# Patient Record
Sex: Female | Born: 1950
Health system: Southern US, Community
[De-identification: ages and names within clinical notes are randomized; demographics above are authoritative.]

## PROBLEM LIST (undated history)

## (undated) DIAGNOSIS — R202 Paresthesia of skin: Secondary | ICD-10-CM

## (undated) DIAGNOSIS — E119 Type 2 diabetes mellitus without complications: Secondary | ICD-10-CM

## (undated) DIAGNOSIS — Z8249 Family history of ischemic heart disease and other diseases of the circulatory system: Secondary | ICD-10-CM

## (undated) DIAGNOSIS — R002 Palpitations: Secondary | ICD-10-CM

## (undated) DIAGNOSIS — I351 Nonrheumatic aortic (valve) insufficiency: Secondary | ICD-10-CM

## (undated) DIAGNOSIS — K449 Diaphragmatic hernia without obstruction or gangrene: Secondary | ICD-10-CM

## (undated) DIAGNOSIS — E041 Nontoxic single thyroid nodule: Secondary | ICD-10-CM

## (undated) DIAGNOSIS — I10 Essential (primary) hypertension: Secondary | ICD-10-CM

## (undated) DIAGNOSIS — M1990 Unspecified osteoarthritis, unspecified site: Secondary | ICD-10-CM

## (undated) DIAGNOSIS — J45909 Unspecified asthma, uncomplicated: Secondary | ICD-10-CM

## (undated) DIAGNOSIS — K219 Gastro-esophageal reflux disease without esophagitis: Secondary | ICD-10-CM

## (undated) DIAGNOSIS — Z8719 Personal history of other diseases of the digestive system: Secondary | ICD-10-CM

## (undated) DIAGNOSIS — I8390 Asymptomatic varicose veins of unspecified lower extremity: Secondary | ICD-10-CM

## (undated) DIAGNOSIS — E785 Hyperlipidemia, unspecified: Secondary | ICD-10-CM

## (undated) HISTORY — DX: Paresthesia of skin: R20.2

## (undated) HISTORY — DX: Hyperlipidemia, unspecified: E78.5

## (undated) HISTORY — DX: Family history of ischemic heart disease and other diseases of the circulatory system: Z82.49

## (undated) HISTORY — DX: Palpitations: R00.2

## (undated) HISTORY — DX: Diaphragmatic hernia without obstruction or gangrene: K44.9

## (undated) HISTORY — DX: Asymptomatic varicose veins of unspecified lower extremity: I83.90

## (undated) HISTORY — DX: Unspecified osteoarthritis, unspecified site: M19.90

## (undated) HISTORY — DX: Nontoxic single thyroid nodule: E04.1

## (undated) HISTORY — DX: Type 2 diabetes mellitus without complications: E11.9

## (undated) HISTORY — DX: Personal history of other diseases of the digestive system: Z87.19

## (undated) HISTORY — DX: Nonrheumatic aortic (valve) insufficiency: I35.1

## (undated) HISTORY — DX: Essential (primary) hypertension: I10

## (undated) HISTORY — DX: Gastro-esophageal reflux disease without esophagitis: K21.9

---

## 1972-02-23 HISTORY — PX: GALLBLADDER SURGERY: SHX652

## 1988-02-23 HISTORY — PX: TUBAL LIGATION: SHX77

## 1995-02-23 HISTORY — PX: UMBILICAL HERNIA REPAIR: SHX196

## 2001-02-22 HISTORY — PX: HERNIA REPAIR: SHX51

## 2001-02-22 HISTORY — PX: ABDOMINAL HYSTERECTOMY: SHX81

## 2001-08-15 ENCOUNTER — Emergency Department (HOSPITAL_COMMUNITY): Admission: EM | Admit: 2001-08-15 | Discharge: 2001-08-15 | Payer: Self-pay | Admitting: Emergency Medicine

## 2002-06-18 ENCOUNTER — Encounter: Admission: RE | Admit: 2002-06-18 | Discharge: 2002-09-16 | Payer: Self-pay | Admitting: Internal Medicine

## 2002-07-16 ENCOUNTER — Other Ambulatory Visit: Admission: RE | Admit: 2002-07-16 | Discharge: 2002-07-16 | Payer: Self-pay | Admitting: Obstetrics and Gynecology

## 2002-07-18 ENCOUNTER — Ambulatory Visit: Admission: RE | Admit: 2002-07-18 | Discharge: 2002-07-18 | Payer: Self-pay | Admitting: Internal Medicine

## 2002-07-18 ENCOUNTER — Ambulatory Visit (HOSPITAL_COMMUNITY): Admission: RE | Admit: 2002-07-18 | Discharge: 2002-07-18 | Payer: Self-pay | Admitting: Internal Medicine

## 2002-07-18 ENCOUNTER — Encounter: Payer: Self-pay | Admitting: Internal Medicine

## 2002-08-20 ENCOUNTER — Ambulatory Visit (HOSPITAL_COMMUNITY): Admission: RE | Admit: 2002-08-20 | Discharge: 2002-08-20 | Payer: Self-pay | Admitting: *Deleted

## 2003-02-23 HISTORY — PX: COLON SURGERY: SHX602

## 2003-06-05 ENCOUNTER — Ambulatory Visit (HOSPITAL_COMMUNITY): Admission: RE | Admit: 2003-06-05 | Discharge: 2003-06-05 | Payer: Self-pay

## 2003-06-14 ENCOUNTER — Encounter: Admission: RE | Admit: 2003-06-14 | Discharge: 2003-06-14 | Payer: Self-pay | Admitting: *Deleted

## 2003-06-24 ENCOUNTER — Inpatient Hospital Stay (HOSPITAL_COMMUNITY): Admission: AD | Admit: 2003-06-24 | Discharge: 2003-06-29 | Payer: Self-pay

## 2003-12-17 ENCOUNTER — Other Ambulatory Visit: Admission: RE | Admit: 2003-12-17 | Discharge: 2003-12-17 | Payer: Self-pay | Admitting: Obstetrics and Gynecology

## 2005-04-27 ENCOUNTER — Other Ambulatory Visit: Admission: RE | Admit: 2005-04-27 | Discharge: 2005-04-27 | Payer: Self-pay | Admitting: Obstetrics and Gynecology

## 2008-12-26 ENCOUNTER — Inpatient Hospital Stay: Payer: Self-pay | Admitting: Internal Medicine

## 2010-03-26 ENCOUNTER — Other Ambulatory Visit: Payer: Self-pay | Admitting: Obstetrics and Gynecology

## 2010-03-26 DIAGNOSIS — N6320 Unspecified lump in the left breast, unspecified quadrant: Secondary | ICD-10-CM

## 2010-04-01 ENCOUNTER — Ambulatory Visit
Admission: RE | Admit: 2010-04-01 | Discharge: 2010-04-01 | Disposition: A | Discharge status: Home / Self Care | Payer: BC Managed Care – PPO | Source: Ambulatory Visit | Attending: Obstetrics and Gynecology | Admitting: Obstetrics and Gynecology

## 2010-04-01 DIAGNOSIS — N6320 Unspecified lump in the left breast, unspecified quadrant: Secondary | ICD-10-CM

## 2011-05-13 ENCOUNTER — Other Ambulatory Visit: Payer: Self-pay | Admitting: Obstetrics and Gynecology

## 2011-05-13 DIAGNOSIS — Z1231 Encounter for screening mammogram for malignant neoplasm of breast: Secondary | ICD-10-CM

## 2011-06-02 ENCOUNTER — Ambulatory Visit: Payer: BC Managed Care – PPO

## 2011-06-23 ENCOUNTER — Ambulatory Visit
Admission: RE | Admit: 2011-06-23 | Discharge: 2011-06-23 | Disposition: A | Discharge status: Home / Self Care | Payer: BC Managed Care – PPO | Source: Ambulatory Visit | Attending: Obstetrics and Gynecology | Admitting: Obstetrics and Gynecology

## 2011-06-23 DIAGNOSIS — Z1231 Encounter for screening mammogram for malignant neoplasm of breast: Secondary | ICD-10-CM

## 2012-05-30 ENCOUNTER — Other Ambulatory Visit: Payer: Self-pay | Admitting: Gastroenterology

## 2012-07-18 ENCOUNTER — Other Ambulatory Visit: Payer: Self-pay

## 2012-07-18 DIAGNOSIS — Z1231 Encounter for screening mammogram for malignant neoplasm of breast: Secondary | ICD-10-CM

## 2012-08-16 ENCOUNTER — Ambulatory Visit: Payer: BC Managed Care – PPO

## 2012-08-24 ENCOUNTER — Ambulatory Visit
Admission: RE | Admit: 2012-08-24 | Discharge: 2012-08-24 | Disposition: A | Discharge status: Home / Self Care | Payer: BC Managed Care – PPO | Source: Ambulatory Visit

## 2012-08-24 DIAGNOSIS — Z1231 Encounter for screening mammogram for malignant neoplasm of breast: Secondary | ICD-10-CM

## 2013-06-06 ENCOUNTER — Other Ambulatory Visit: Payer: Self-pay | Admitting: Internal Medicine

## 2013-06-06 DIAGNOSIS — I6529 Occlusion and stenosis of unspecified carotid artery: Secondary | ICD-10-CM

## 2013-06-08 ENCOUNTER — Ambulatory Visit
Admission: RE | Admit: 2013-06-08 | Discharge: 2013-06-08 | Disposition: A | Discharge status: Home / Self Care | Payer: BC Managed Care – PPO | Source: Ambulatory Visit | Attending: Internal Medicine | Admitting: Internal Medicine

## 2013-06-08 DIAGNOSIS — I6529 Occlusion and stenosis of unspecified carotid artery: Secondary | ICD-10-CM

## 2013-06-12 ENCOUNTER — Telehealth: Payer: Self-pay | Admitting: *Deleted

## 2013-06-29 NOTE — Telephone Encounter (Signed)
Encounter Closed---5/8 TP 

## 2013-07-06 ENCOUNTER — Ambulatory Visit: Payer: BC Managed Care – PPO | Admitting: Cardiovascular Disease

## 2013-07-27 ENCOUNTER — Telehealth: Payer: Self-pay | Admitting: Cardiovascular Disease

## 2013-07-27 NOTE — Telephone Encounter (Signed)
Closed encounter °

## 2013-08-01 ENCOUNTER — Ambulatory Visit (INDEPENDENT_AMBULATORY_CARE_PROVIDER_SITE_OTHER): Payer: BC Managed Care – PPO | Admitting: Cardiovascular Disease

## 2013-08-01 ENCOUNTER — Encounter: Payer: Self-pay | Admitting: Cardiovascular Disease

## 2013-08-01 VITALS — BP 112/70 | HR 72 | Ht 61.0 in | Wt 194.0 lb

## 2013-08-01 DIAGNOSIS — R002 Palpitations: Secondary | ICD-10-CM

## 2013-08-01 DIAGNOSIS — I10 Essential (primary) hypertension: Secondary | ICD-10-CM

## 2013-08-01 DIAGNOSIS — G4730 Sleep apnea, unspecified: Secondary | ICD-10-CM | POA: Insufficient documentation

## 2013-08-01 DIAGNOSIS — E785 Hyperlipidemia, unspecified: Secondary | ICD-10-CM

## 2013-08-01 DIAGNOSIS — R209 Unspecified disturbances of skin sensation: Secondary | ICD-10-CM

## 2013-08-01 DIAGNOSIS — R0602 Shortness of breath: Secondary | ICD-10-CM

## 2013-08-01 DIAGNOSIS — R202 Paresthesia of skin: Secondary | ICD-10-CM

## 2013-08-01 DIAGNOSIS — E119 Type 2 diabetes mellitus without complications: Secondary | ICD-10-CM | POA: Insufficient documentation

## 2013-08-01 NOTE — Assessment & Plan Note (Signed)
On statin therapy followed by her PCP 

## 2013-08-01 NOTE — Assessment & Plan Note (Signed)
Occurring daily over the last several months lasting for seconds to minutes at a time we'll get a 2-D echo and a one-month event monitor.

## 2013-08-01 NOTE — Assessment & Plan Note (Signed)
Positive study in the past but not abnormal enough to require CPAP. The patient has lost quite a bit of weight by history

## 2013-08-01 NOTE — Patient Instructions (Signed)
  We will see you back in follow up in 6 months with Dr Gwenlyn Found  Dr Gwenlyn Found has ordered : 1.  Event monitor. Event monitors are medical devices that record the heart's electrical activity. Doctors most often [place] these monitors to diagnose arrhythmias. Arrhythmias are problems with the speed or rhythm of the heartbeat. The monitor is a small, portable device. You can wear one while you do your normal daily activities. This is usually used to diagnose what is causing palpitations/syncope (passing out).  2.  Echocardiogram. Echocardiography is a painless test that uses sound waves to create images of your heart. It provides your doctor with information about the size and shape of your heart and how well your heart's chambers and valves are working. This procedure takes approximately one hour. There are no restrictions for this procedure.

## 2013-08-01 NOTE — Progress Notes (Signed)
08/01/2013 Paula [name]   Oct 31, 1950  [id number]  Primary Physician Paula Pel, MD Primary Cardiologist: Lorretta Harp MD Paula [name]   HPI:  Paula [name] is a pleasant 63 year old moderately overweight Paula [name] Caucasian female mother of 3 children, grandmother and 7 grandchildren referred to me by Paula [name] at [facility] for cardiovascular evaluation because of palpitations. Her cardiovascular risk factor profile is remarkable for tobacco abuse having smoked one to 4 packs a day for 26 years and quit in 1988. She does have a family history of heart disease with a mother m renal function at age 24 and a son who had an MI at the age of 43 requiring 2 stents. She has treated hypertension, hyperlipidemia and diabetes. She has never had a heart attack or stroke. She denies chest pain but does get some dyspnea on exertion. She's noted palpitations he could which began back in February and occur on a daily basis. She does have left-sided facial and body paresthesias. She's had a negative carotid Doppler studies.     Current Outpatient Prescriptions  Medication Sig Dispense Refill  . aspirin 81 MG tablet Take 81 mg by mouth daily.      . hydrochlorothiazide (MICROZIDE) 12.5 MG capsule Take 12.5 mg by mouth every other day.       . lisinopril (PRINIVIL,ZESTRIL) 20 MG tablet Take 10 mg by mouth daily.      . metformin (FORTAMET) 1000 MG (OSM) 24 hr tablet Take 1,000 mg by mouth 2 (two) times daily with a meal.       . NON FORMULARY DHA+EPA Purity vitamin      . NON FORMULARY Immune health blend      . rosuvastatin (CRESTOR) 10 MG tablet Take 5 mg by mouth daily.      . sertraline (ZOLOFT) 50 MG tablet Take 50 mg by mouth daily.       Marland Kitchen triamcinolone cream (KENALOG) 0.1 % Apply 1 application topically as needed.       No current facility-administered medications for this visit.    Allergies  Allergen Reactions  . Tetracyclines & Related     Upset  stomach    History   Social History  . Marital Status: Married    Spouse Name: N/A    Number of Children: N/A  . Years of Education: N/A   Occupational History  . Not on file.   Social History Main Topics  . Smoking status: Former Smoker    Quit date: 02/22/1986  . Smokeless tobacco: Not on file  . Alcohol Use: Not on file  . Drug Use: Not on file  . Sexual Activity: Not on file   Other Topics Concern  . Not on file   Social History Narrative  . No narrative on file     Review of Systems: General: negative for chills, fever, night sweats or weight changes.  Cardiovascular: negative for chest pain, dyspnea on exertion, edema, orthopnea, palpitations, paroxysmal nocturnal dyspnea or shortness of breath Dermatological: negative for rash Respiratory: negative for cough or wheezing Urologic: negative for hematuria Abdominal: negative for nausea, vomiting, diarrhea, bright red blood per rectum, melena, or hematemesis Neurologic: negative for visual changes, syncope, or dizziness All other systems reviewed and are otherwise negative except as noted above.    Blood pressure 112/70, pulse 72, height 5\' 1"  (1.549 m), weight 194 lb (87.998 kg).  General appearance: alert and no distress Neck: no adenopathy, no carotid bruit, no JVD,  supple, symmetrical, trachea midline and thyroid not enlarged, symmetric, no tenderness/mass/nodules Lungs: clear to auscultation bilaterally Heart: regular rate and rhythm, S1, S2 normal, no murmur, click, rub or gallop Extremities: extremities normal, atraumatic, no cyanosis or edema  EKG normal sinus rhythm at 72 without ST or T wave changes  ASSESSMENT AND PLAN:   Sleep apnea Positive study in the past but not abnormal enough to require CPAP. The patient has lost quite a bit of weight by history  Paresthesias Left-sided facial tingling as well as left-sided upper lobe shoemaking laying. Normal carotid Doppler studies. I do not think the  symptoms are cardiovascular nature but most likely either radicular or an intracranial. They do not sound like diabetic peripheral neuropathy. I suggested that she obtain a neurologic evaluation  Palpitations Occurring daily over the last several months lasting for seconds to minutes at a time we'll get a 2-D echo and a one-month event monitor.  Hyperlipidemia On statin therapy followed by her PCP  Essential hypertension Well-controlled on current medications      Lorretta Harp MD [facility], [facility] 08/01/2013 10:54 AM

## 2013-08-01 NOTE — Assessment & Plan Note (Signed)
Left-sided facial tingling as well as left-sided upper lobe shoemaking laying. Normal carotid Doppler studies. I do not think the symptoms are cardiovascular nature but most likely either radicular or an intracranial. They do not sound like diabetic peripheral neuropathy. I suggested that she obtain a neurologic evaluation

## 2013-08-01 NOTE — Assessment & Plan Note (Signed)
Well-controlled on current medications 

## 2013-08-02 ENCOUNTER — Ambulatory Visit: Payer: BC Managed Care – PPO | Admitting: Cardiovascular Disease

## 2013-08-09 ENCOUNTER — Ambulatory Visit (HOSPITAL_COMMUNITY)
Admission: RE | Admit: 2013-08-09 | Discharge: 2013-08-09 | Disposition: A | Discharge status: Home / Self Care | Payer: BC Managed Care – PPO | Source: Ambulatory Visit | Attending: Cardiovascular Disease | Admitting: Cardiovascular Disease

## 2013-08-09 DIAGNOSIS — I369 Nonrheumatic tricuspid valve disorder, unspecified: Secondary | ICD-10-CM

## 2013-08-09 DIAGNOSIS — R002 Palpitations: Secondary | ICD-10-CM | POA: Insufficient documentation

## 2013-08-09 DIAGNOSIS — R0602 Shortness of breath: Secondary | ICD-10-CM

## 2013-08-09 DIAGNOSIS — I10 Essential (primary) hypertension: Secondary | ICD-10-CM

## 2013-08-09 NOTE — Progress Notes (Signed)
2D Echo Performed 08/09/2013    Tammie Crouch, RCS  

## 2013-08-14 ENCOUNTER — Encounter: Payer: Self-pay | Admitting: *Deleted

## 2013-08-21 ENCOUNTER — Ambulatory Visit: Payer: BC Managed Care – PPO | Admitting: Cardiovascular Disease

## 2013-09-10 ENCOUNTER — Encounter: Payer: Self-pay | Admitting: *Deleted

## 2013-12-25 ENCOUNTER — Other Ambulatory Visit: Payer: Self-pay

## 2013-12-25 DIAGNOSIS — Z1231 Encounter for screening mammogram for malignant neoplasm of breast: Secondary | ICD-10-CM

## 2014-03-05 ENCOUNTER — Ambulatory Visit
Admission: RE | Admit: 2014-03-05 | Discharge: 2014-03-05 | Disposition: A | Discharge status: Home / Self Care | Payer: Medicare Other | Source: Ambulatory Visit

## 2014-03-05 DIAGNOSIS — Z1231 Encounter for screening mammogram for malignant neoplasm of breast: Secondary | ICD-10-CM

## 2014-03-22 DIAGNOSIS — Z01419 Encounter for gynecological examination (general) (routine) without abnormal findings: Secondary | ICD-10-CM | POA: Diagnosis not present

## 2014-03-22 DIAGNOSIS — M8588 Other specified disorders of bone density and structure, other site: Secondary | ICD-10-CM | POA: Diagnosis not present

## 2014-03-22 DIAGNOSIS — N958 Other specified menopausal and perimenopausal disorders: Secondary | ICD-10-CM | POA: Diagnosis not present

## 2014-07-18 DIAGNOSIS — E119 Type 2 diabetes mellitus without complications: Secondary | ICD-10-CM | POA: Diagnosis not present

## 2014-07-18 DIAGNOSIS — I10 Essential (primary) hypertension: Secondary | ICD-10-CM | POA: Diagnosis not present

## 2014-07-18 DIAGNOSIS — E780 Pure hypercholesterolemia: Secondary | ICD-10-CM | POA: Diagnosis not present

## 2014-07-25 ENCOUNTER — Other Ambulatory Visit: Payer: Self-pay | Admitting: Internal Medicine

## 2014-07-25 DIAGNOSIS — R202 Paresthesia of skin: Secondary | ICD-10-CM | POA: Diagnosis not present

## 2014-07-25 DIAGNOSIS — R0989 Other specified symptoms and signs involving the circulatory and respiratory systems: Secondary | ICD-10-CM

## 2014-07-25 DIAGNOSIS — Z0000 Encounter for general adult medical examination without abnormal findings: Secondary | ICD-10-CM | POA: Diagnosis not present

## 2014-07-25 DIAGNOSIS — E780 Pure hypercholesterolemia: Secondary | ICD-10-CM | POA: Diagnosis not present

## 2014-07-25 DIAGNOSIS — I10 Essential (primary) hypertension: Secondary | ICD-10-CM | POA: Diagnosis not present

## 2014-07-25 DIAGNOSIS — R6889 Other general symptoms and signs: Secondary | ICD-10-CM

## 2014-07-26 ENCOUNTER — Ambulatory Visit
Admission: RE | Admit: 2014-07-26 | Discharge: 2014-07-26 | Disposition: A | Discharge status: Home / Self Care | Payer: Medicare Other | Source: Ambulatory Visit | Attending: Internal Medicine | Admitting: Internal Medicine

## 2014-07-26 DIAGNOSIS — R0989 Other specified symptoms and signs involving the circulatory and respiratory systems: Secondary | ICD-10-CM

## 2014-07-26 DIAGNOSIS — E041 Nontoxic single thyroid nodule: Secondary | ICD-10-CM | POA: Diagnosis not present

## 2014-07-26 DIAGNOSIS — R6889 Other general symptoms and signs: Secondary | ICD-10-CM

## 2014-08-13 ENCOUNTER — Ambulatory Visit: Payer: Medicare Other | Admitting: Diagnostic Neuroimaging

## 2014-08-20 DIAGNOSIS — N10 Acute tubulo-interstitial nephritis: Secondary | ICD-10-CM | POA: Diagnosis not present

## 2014-08-20 DIAGNOSIS — R509 Fever, unspecified: Secondary | ICD-10-CM | POA: Diagnosis not present

## 2014-08-22 DIAGNOSIS — N10 Acute tubulo-interstitial nephritis: Secondary | ICD-10-CM | POA: Diagnosis not present

## 2014-08-26 ENCOUNTER — Emergency Department
Admission: EM | Admit: 2014-08-26 | Discharge: 2014-08-26 | Disposition: A | Discharge status: Home / Self Care | Payer: Medicare Other | Attending: Emergency Medicine | Admitting: Emergency Medicine

## 2014-08-26 ENCOUNTER — Encounter: Payer: Self-pay | Admitting: *Deleted

## 2014-08-26 DIAGNOSIS — Z87891 Personal history of nicotine dependence: Secondary | ICD-10-CM | POA: Insufficient documentation

## 2014-08-26 DIAGNOSIS — I10 Essential (primary) hypertension: Secondary | ICD-10-CM | POA: Insufficient documentation

## 2014-08-26 DIAGNOSIS — E119 Type 2 diabetes mellitus without complications: Secondary | ICD-10-CM | POA: Insufficient documentation

## 2014-08-26 DIAGNOSIS — Z7982 Long term (current) use of aspirin: Secondary | ICD-10-CM | POA: Insufficient documentation

## 2014-08-26 DIAGNOSIS — Z79899 Other long term (current) drug therapy: Secondary | ICD-10-CM | POA: Insufficient documentation

## 2014-08-26 DIAGNOSIS — A46 Erysipelas: Secondary | ICD-10-CM

## 2014-08-26 DIAGNOSIS — M79605 Pain in left leg: Secondary | ICD-10-CM | POA: Diagnosis present

## 2014-08-26 LAB — URINALYSIS COMPLETE WITH MICROSCOPIC (ARMC ONLY)
Bacteria, UA: NONE SEEN
Bilirubin Urine: NEGATIVE
GLUCOSE, UA: NEGATIVE mg/dL
Ketones, ur: NEGATIVE mg/dL
Nitrite: NEGATIVE
PH: 5 (ref 5.0–8.0)
PROTEIN: NEGATIVE mg/dL
Specific Gravity, Urine: 1.017 (ref 1.005–1.030)

## 2014-08-26 LAB — CBC WITH DIFFERENTIAL/PLATELET
Basophils Absolute: 0.1 10*3/uL (ref 0–0.1)
Basophils Relative: 1 %
EOS PCT: 2 %
Eosinophils Absolute: 0.2 10*3/uL (ref 0–0.7)
HEMATOCRIT: 37 % (ref 35.0–47.0)
Hemoglobin: 12.1 g/dL (ref 12.0–16.0)
Lymphocytes Relative: 19 %
Lymphs Abs: 2.1 10*3/uL (ref 1.0–3.6)
MCH: 28.5 pg (ref 26.0–34.0)
MCHC: 32.8 g/dL (ref 32.0–36.0)
MCV: 86.8 fL (ref 80.0–100.0)
MONOS PCT: 9 %
Monocytes Absolute: 0.9 10*3/uL (ref 0.2–0.9)
Neutro Abs: 7.5 10*3/uL — ABNORMAL HIGH (ref 1.4–6.5)
Neutrophils Relative %: 69 %
Platelets: 314 10*3/uL (ref 150–440)
RBC: 4.26 MIL/uL (ref 3.80–5.20)
RDW: 13.2 % (ref 11.5–14.5)
WBC: 10.8 10*3/uL (ref 3.6–11.0)

## 2014-08-26 LAB — BASIC METABOLIC PANEL
Anion gap: 11 (ref 5–15)
BUN: 11 mg/dL (ref 6–20)
CHLORIDE: 103 mmol/L (ref 101–111)
CO2: 29 mmol/L (ref 22–32)
Calcium: 9.3 mg/dL (ref 8.9–10.3)
Creatinine, Ser: 0.69 mg/dL (ref 0.44–1.00)
GFR calc non Af Amer: >60 mL/min (ref >60)
Glucose, Bld: 114 mg/dL — ABNORMAL HIGH (ref 65–99)
Potassium: 4.3 mmol/L (ref 3.5–5.1)
SODIUM: 143 mmol/L (ref 135–145)

## 2014-08-26 MED ORDER — PROMETHAZINE HCL 25 MG PO TABS: 25.0000 mg | ORAL_TABLET | Freq: Four times a day (QID) | ORAL | Status: DC | PRN

## 2014-08-26 MED ORDER — CLINDAMYCIN HCL 300 MG PO CAPS: 300.0000 mg | ORAL_CAPSULE | Freq: Four times a day (QID) | ORAL | Status: DC

## 2014-08-26 MED ORDER — CLINDAMYCIN HCL 150 MG PO CAPS
300.0000 mg | ORAL_CAPSULE | Freq: Four times a day (QID) | ORAL | Status: DC
  Administered 2014-08-26: 300 mg via ORAL

## 2014-08-26 MED ORDER — CLINDAMYCIN HCL 150 MG PO CAPS
ORAL_CAPSULE | ORAL | Status: AC
  Administered 2014-08-26: 300 mg via ORAL
  Filled 2014-08-26: qty 2

## 2014-08-26 NOTE — ED Notes (Signed)
Pt c/o left leg pain along inner thigh since late Friday.  PT reports varicose veins.  Pt reports more pain when walking but not difficulty ambulating.  Pt denies injury.  Pt reports swelling.  Pt NAD at this time.

## 2014-08-26 NOTE — ED Notes (Signed)
Soreness reddness to left inner thigh

## 2014-08-26 NOTE — Discharge Instructions (Signed)
Erysipelas Erysipelas is a sudden form of cellulitis (inflammation of the cells) that affects the tissues near the skin surface. It is most often caused by a streptococcal or staphylococcal (germ) infection. SYMPTOMS Erysipelas begins as just not feeling well (malaise), chills, and a fever of usually 101 F (38.3 C) to 104 F (40 C). Being it is an inflammation (soreness) of the skin and the tissue just beneath the skin; it shows up as a reddened area with sharp borders. It may be streaked because the lymphatics are infected. These are lymph channels that flow out of your glands (lymph nodes), like the glands in your neck. The reddened area may be tender to touch with itching and burning of the skin. Sometimes this is accompanied by feelings of nausea (you are sick to your stomach) and vomiting (throwing up). Sometimes there may be a break in the skin over the reddened area which is where the bacteria (germs) entered the body. Sometimes there may not appear to be a site of entry. The most common area for erysipelas to appear is on the lower legs. When the legs are infected, it is usually the glands (lymph nodes) in the groin that may be enlarged and tender. DIAGNOSIS  Your caregiver most often bases the diagnosis (learning what is wrong) on your physical findings (examination). It is often hard to grow the germs that produce this illness. Sometimes blood cultures (to see what germs may be growing in your blood) will be done if there is a high fever and the blood cultures are likely to be positive. This means the culture is able to grow the bacteria (germ) producing the erysipelas. If blood counts are done, the white blood count is usually elevated. The ESR (erythrocyte sedimentation rate) is also usually elevated (higher than normal). The ESR is just a nonspecific sign of infection being present. TREATMENT  This infection usually responds rapidly to medications which kill germs (antibiotics). Depending on  findings and course of the illness (gets better or worse), your caregiver will be able to decide which is the best possible treatment for you. Most often these infections respond well to penicillin in individuals not allergic to penicillin. Other alternatives are available for those who cannot take penicillin. HOME CARE INSTRUCTIONS   You may return to work as directed.  Only take over-the-counter or prescription medicines for pain, discomfort, or fever as directed by your caregiver.  Finish all antibiotics as prescribed by your caregiver even if it looks as if the infection has cleared completely. SEEK MEDICAL CARE IF:   Your chills and feelings of illness are getting worse.  You have pain or discomfort not controlled by medications, or if symptoms seem to be getting worse rather than better.  The reddened area of infection seems to be spreading rather than getting smaller, red lines are extending away from the infection toward your chest or abdomen, or a part of the infection begins to turn dark in color.  The problem returns in the same or another area after it seems to have gone away. MAKE SURE YOU:   Understand these instructions.  Will watch your condition.  Will get help right away if you are not doing well or get worse. Document Released: 11/03/2000 Document Revised: 05/03/2011 Document Reviewed: 09/27/2007 [facility] Patient Information 2015 [place], [place]. This information is not intended to replace advice given to you by your health care provider. Make sure you discuss any questions you have with your health care provider.  Apply warm  compresses to the area to reduce pain and promote healing.  Follow-up with your provider this week if symptoms don't improve. Take the antibiotic as directed until completely gone.

## 2014-08-26 NOTE — ED Provider Notes (Signed)
[facility] Emergency Department Provider Note ____________________________________________  Time seen: 2003  I have reviewed the triage vital signs and the nursing notes.  HISTORY  Chief Complaint  Leg Pain  HPI Paula [name] is a 64 y.o. female for evaluation and treatment of pain, redness, and swelling to the inner left thigh. She describes onset late Friday, but denies any injury, trauma, insect bite or any problems with that area.  She is also without fevers, chills, sweats, or nausea.   Past Medical History  Diagnosis Date  . Aortic valve insufficiency   . Osteoarthritis   . Varicose vein   . Family history of heart disease     mother, son, and grandparent  . Hyperlipidemia   . Diabetes     type II  . HTN (hypertension)   . Hiatal hernia   . GERD (gastroesophageal reflux disease)   . History of small bowel obstruction   . Palpitations   . Paresthesias     left-sided    Patient Active Problem List   Diagnosis Date Noted  . Essential hypertension 08/01/2013  . Hyperlipidemia 08/01/2013  . Diabetes 08/01/2013  . Sleep apnea 08/01/2013  . Morbid obesity 08/01/2013  . Palpitations 08/01/2013  . Paresthesias 08/01/2013    Past Surgical History  Procedure Laterality Date  . Gallbladder surgery  1974  . Tubal ligation  1990  . Umbilical hernia repair  2001  . Abdominal hysterectomy  2003  . Colon surgery  2005    colon blockage due to adhesions    Current Outpatient Rx  Name  Route  Sig  Dispense  Refill  . aspirin 81 MG tablet   Oral   Take 81 mg by mouth daily.         . clindamycin (CLEOCIN) 300 MG capsule   Oral   Take 1 capsule (300 mg total) by mouth 4 (four) times daily.   40 capsule   0   . hydrochlorothiazide (MICROZIDE) 12.5 MG capsule   Oral   Take 12.5 mg by mouth every other day.          . lisinopril (PRINIVIL,ZESTRIL) 20 MG tablet   Oral   Take 10 mg by mouth daily.         . metformin (FORTAMET)  1000 MG (OSM) 24 hr tablet   Oral   Take 1,000 mg by mouth 2 (two) times daily with a meal.          . NON FORMULARY      DHA+EPA Purity vitamin         . NON FORMULARY      Immune health blend         . promethazine (PHENERGAN) 25 MG tablet   Oral   Take 1 tablet (25 mg total) by mouth every 6 (six) hours as needed for nausea or vomiting.   10 tablet   0   . rosuvastatin (CRESTOR) 10 MG tablet   Oral   Take 5 mg by mouth daily.         . sertraline (ZOLOFT) 50 MG tablet   Oral   Take 50 mg by mouth daily.          Marland Kitchen triamcinolone cream (KENALOG) 0.1 %   Topical   Apply 1 application topically as needed.           Allergies Tetracyclines & related  Family History  Problem Relation Age of Onset  . Cancer Mother  lung  . Heart attack Mother     age 44  . Stroke Father     "mini strokes"  . Cancer Father     prostate  . Diabetes Maternal Grandmother   . Stroke Maternal Grandmother   . Hypertension Maternal Grandmother   . Heart disease Paternal Grandfather   . Hypertension Brother   . Hyperlipidemia Brother   . Heart attack Son     at age 3    Social History History  Substance Use Topics  . Smoking status: Former Smoker    Quit date: 02/22/1986  . Smokeless tobacco: Not on file  . Alcohol Use: No   Review of Systems  Constitutional: Negative for fever. Eyes: Negative for visual changes. ENT: Negative for sore throat. Cardiovascular: Negative for chest pain. Respiratory: Negative for shortness of breath. Gastrointestinal: Negative for abdominal pain, vomiting and diarrhea. Genitourinary: Negative for dysuria. Musculoskeletal: Negative for back pain. Skin: Negative for rash. Painful, red skin as above Neurological: Negative for headaches, focal weakness or numbness. ____________________________________________  PHYSICAL EXAM:  VITAL SIGNS: ED Triage Vitals  Enc Vitals Group     BP 08/26/14 1844 135/58 mmHg     Pulse Rate  08/26/14 1844 87     Resp 08/26/14 1844 20     Temp 08/26/14 1844 98.3 F (36.8 C)     Temp Source 08/26/14 1844 Oral     SpO2 08/26/14 1844 97 %     Weight 08/26/14 1844 188 lb (85.276 kg)     Height 08/26/14 1844 5\' 2"  (1.575 m)     Head Cir --      Peak Flow --      Pain Score 08/26/14 1844 4     Pain Loc --      Pain Edu? --      Excl. in [place]? --    Constitutional: Alert and oriented. Well appearing and in no distress. Eyes: Conjunctivae are normal. PERRL. Normal extraocular movements. ENT   Head: Normocephalic and atraumatic.   Nose: No congestion/rhinnorhea.   Mouth/Throat: Mucous membranes are moist.   Neck: Supple. No thyromegaly. Hematological/Lymphatic/Immunilogical: No cervical lymphadenopathy. Cardiovascular: Normal rate, regular rhythm.  Respiratory: Normal respiratory effort. No wheezes/rales/rhonchi. Gastrointestinal: Soft and nontender. No distention. Musculoskeletal: Nontender with normal range of motion in all extremities.  Neurologic:  Normal gait without ataxia. Normal speech and language. No gross focal neurologic deficits are appreciated. Skin:  Skin is warm, dry and intact. No rash noted. Left inner thigh with well-demarcated erythematous, warm, and mildly indurated skin. No boil, abscess, pointing, or fluctuance is noted.  Psychiatric: Mood and affect are normal. Patient exhibits appropriate insight and judgment. ____________________________________________    LABS (pertinent positives/negatives) Labs Reviewed  BASIC METABOLIC PANEL - Abnormal; Notable for the following:    Glucose, Bld 114 (*)    All other components within normal limits  CBC WITH DIFFERENTIAL/PLATELET - Abnormal; Notable for the following:    Neutro Abs 7.5 (*)    All other components within normal limits  URINALYSIS COMPLETEWITH MICROSCOPIC ([facility] ONLY) - Abnormal; Notable for the following:    Color, Urine YELLOW (*)    APPearance CLEAR (*)    Hgb urine dipstick 1+ (*)     Leukocytes, UA TRACE (*)    Squamous Epithelial / LPF 0-5 (*)    All other components within normal limits  ____________________________________________  PROCEDURES  Clindamycin 300 mg PO ____________________________________________  INITIAL IMPRESSION / ASSESSMENT AND PLAN / ED COURSE  Treatment for  acute erysipelas of the left thigh. Lab results reviewed with the patient.  Treatment with clindamycin and follow-up with primary care provider or return as discussed. ____________________________________________  FINAL CLINICAL IMPRESSION(S) / ED DIAGNOSES  Final diagnoses:  Erysipelas of lower extremity     Melvenia Needles, PA-C 08/26/14 2213  Ahmed Prima, MD 08/26/14 [phone]

## 2014-08-29 DIAGNOSIS — I10 Essential (primary) hypertension: Secondary | ICD-10-CM | POA: Diagnosis not present

## 2014-08-29 DIAGNOSIS — E119 Type 2 diabetes mellitus without complications: Secondary | ICD-10-CM | POA: Diagnosis not present

## 2014-08-29 DIAGNOSIS — E780 Pure hypercholesterolemia: Secondary | ICD-10-CM | POA: Diagnosis not present

## 2014-08-30 DIAGNOSIS — L03116 Cellulitis of left lower limb: Secondary | ICD-10-CM | POA: Diagnosis not present

## 2014-09-03 ENCOUNTER — Ambulatory Visit (HOSPITAL_COMMUNITY)
Admission: RE | Admit: 2014-09-03 | Discharge: 2014-09-03 | Disposition: A | Discharge status: Home / Self Care | Payer: Medicare Other | Source: Ambulatory Visit | Attending: Internal Medicine | Admitting: Internal Medicine

## 2014-09-03 ENCOUNTER — Other Ambulatory Visit (HOSPITAL_COMMUNITY): Payer: Self-pay | Admitting: Internal Medicine

## 2014-09-03 DIAGNOSIS — I82812 Embolism and thrombosis of superficial veins of left lower extremities: Secondary | ICD-10-CM | POA: Diagnosis not present

## 2014-09-03 DIAGNOSIS — I809 Phlebitis and thrombophlebitis of unspecified site: Secondary | ICD-10-CM | POA: Diagnosis not present

## 2014-09-03 DIAGNOSIS — M79605 Pain in left leg: Secondary | ICD-10-CM | POA: Diagnosis present

## 2014-09-03 NOTE — Progress Notes (Signed)
VASCULAR LAB PRELIMINARY  PRELIMINARY  PRELIMINARY  PRELIMINARY  Left lower extremity venous duplex completed.    Preliminary report:  Negative for left lower extremity deep vein thrombus or Baker's cyst. Positive for left greater saphenous thrombus from the knee to mid thigh Also positive for a thrombosed varicosity in the mid left thigh  Hanya Guerin, RVS 09/03/2014, 5:19 PM

## 2014-09-04 ENCOUNTER — Ambulatory Visit: Payer: Medicare Other | Admitting: Diagnostic Neuroimaging

## 2014-09-11 DIAGNOSIS — I809 Phlebitis and thrombophlebitis of unspecified site: Secondary | ICD-10-CM | POA: Diagnosis not present

## 2014-09-13 DIAGNOSIS — E119 Type 2 diabetes mellitus without complications: Secondary | ICD-10-CM | POA: Diagnosis not present

## 2014-09-25 DIAGNOSIS — E069 Thyroiditis, unspecified: Secondary | ICD-10-CM | POA: Diagnosis not present

## 2014-09-25 DIAGNOSIS — E041 Nontoxic single thyroid nodule: Secondary | ICD-10-CM | POA: Diagnosis not present

## 2014-10-08 ENCOUNTER — Ambulatory Visit (INDEPENDENT_AMBULATORY_CARE_PROVIDER_SITE_OTHER): Payer: Medicare Other | Admitting: Diagnostic Neuroimaging

## 2014-10-08 ENCOUNTER — Encounter: Payer: Self-pay | Admitting: Diagnostic Neuroimaging

## 2014-10-08 VITALS — BP 118/64 | HR 69 | Ht 62.0 in | Wt 184.4 lb

## 2014-10-08 DIAGNOSIS — R200 Anesthesia of skin: Secondary | ICD-10-CM | POA: Diagnosis not present

## 2014-10-08 DIAGNOSIS — R202 Paresthesia of skin: Principal | ICD-10-CM

## 2014-10-08 NOTE — Patient Instructions (Signed)
Continue current medications. 

## 2014-10-08 NOTE — Progress Notes (Signed)
[facility]  PATIENT: Paula [name] DOB: 07/31/1950  REFERRING CLINICIAN: Pharr HISTORY FROM: patient  REASON FOR VISIT: new consult    HISTORICAL  CHIEF COMPLAINT:  Chief Complaint  Patient presents with  . Pain    rm 7, New patient, "burning in R heel, numbness/tingling in face"    HISTORY OF PRESENT ILLNESS:    64 year old right handed female with hypertension, diabetes, hypercholesterolemia, anxiety, thyroiditis, mild apnea, here for evaluation of burning sensation in face, hands and feet.  Patient has had intermittent left face numbness, tingling sensation, intermittent, lasting for a few minutes at a time. Sometimes it feels like a pulling sensation. This is happening 6-7 times per year over the past 5 years.  Over past few months patient has had intermittent numbness and burning pain in her right heel, sometimes left heel, as well as intermittent tingling in fingers and toes.  Patient was diagnosed with diabetes in 2009, at that time her A1c was around 12. Over time her diabetes control has gradually improved. Was recent A1c was 6.0.    REVIEW OF SYSTEMS: Full 14 system review of systems performed and notable only for trouble swallowing rash snoring easy bruising feeling hot joint pain confusion numbness diff swallowing anxiety.   ALLERGIES: Allergies  Allergen Reactions  . Tetracyclines & Related     Upset stomach    HOME MEDICATIONS: Outpatient Prescriptions Prior to Visit  Medication Sig Dispense Refill  . aspirin 81 MG tablet Take 81 mg by mouth daily.    . metformin (FORTAMET) 1000 MG (OSM) 24 hr tablet Take 1,000 mg by mouth 2 (two) times daily with a meal.     . NON FORMULARY DHA+EPA Purity vitamin    . NON FORMULARY Immune health blend    . rosuvastatin (CRESTOR) 10 MG tablet Take 5 mg by mouth daily.    . sertraline (ZOLOFT) 50 MG tablet Take 50 mg by mouth daily.     Marland Kitchen triamcinolone cream (KENALOG) 0.1 % Apply 1 application  topically as needed.    . clindamycin (CLEOCIN) 300 MG capsule Take 1 capsule (300 mg total) by mouth 4 (four) times daily. 40 capsule 0  . hydrochlorothiazide (MICROZIDE) 12.5 MG capsule Take 12.5 mg by mouth every other day.     . lisinopril (PRINIVIL,ZESTRIL) 20 MG tablet Take 10 mg by mouth daily.    . promethazine (PHENERGAN) 25 MG tablet Take 1 tablet (25 mg total) by mouth every 6 (six) hours as needed for nausea or vomiting. 10 tablet 0   No facility-administered medications prior to visit.    PAST MEDICAL HISTORY: Past Medical History  Diagnosis Date  . Aortic valve insufficiency   . Osteoarthritis   . Varicose vein   . Family history of heart disease     mother, son, and grandparent  . Hyperlipidemia   . Diabetes     type II  . HTN (hypertension)   . Hiatal hernia   . GERD (gastroesophageal reflux disease)   . History of small bowel obstruction   . Palpitations   . Paresthesias     left-sided  . Thyroid nodule     PAST SURGICAL HISTORY: Past Surgical History  Procedure Laterality Date  . Gallbladder surgery  1974  . Tubal ligation  1990  . Umbilical hernia repair  1997  . Abdominal hysterectomy  2003  . Colon surgery  2005    colon blockage due to adhesions  . Hernia repair  2003  incisional    FAMILY HISTORY: Family History  Problem Relation Age of Onset  . Cancer Mother     lung  . Heart attack Mother     age 92  . Stroke Father     "mini strokes"  . Cancer Father     prostate  . Diabetes Maternal Grandmother   . Stroke Maternal Grandmother   . Hypertension Maternal Grandmother   . Heart disease Paternal Grandfather   . Hypertension Brother   . Hyperlipidemia Brother   . Heart attack Son     at age 40    SOCIAL HISTORY:  Social History   Social History  . Marital Status: Married    Spouse Name: Fritz Pickerel  . Number of Children: 3  . Years of Education: 12   Occupational History  . retired     Marketing executive job   Social History Main  Topics  . Smoking status: Former Smoker -- 3.00 packs/day for 26 years    Quit date: 02/22/1986  . Smokeless tobacco: Not on file  . Alcohol Use: No     Comment: "seldom"  . Drug Use: No  . Sexual Activity: Not on file   Other Topics Concern  . Not on file   Social History Narrative   Lives at home with husband   Caffeine use- 1-2 cups coffee     PHYSICAL EXAM  GENERAL EXAM/CONSTITUTIONAL: Vitals:  Filed Vitals:   10/08/14 1046  BP: 118/64  Pulse: 69  Height: 5\' 2"  (1.575 m)  Weight: 184 lb 6.4 oz (83.643 kg)     Body mass index is 33.72 kg/(m^2).  Visual Acuity Screening   Right eye Left eye Both eyes  Without correction:     With correction: 20/70 20/50      Patient is in no distress; well developed, nourished and groomed; neck is supple  CARDIOVASCULAR:  Examination of carotid arteries is normal; no carotid bruits  Regular rate and rhythm, no murmurs  Examination of peripheral vascular system by observation and palpation is normal  EYES:  Ophthalmoscopic exam of optic discs and posterior segments is normal; no papilledema or hemorrhages  MUSCULOSKELETAL:  Gait, strength, tone, movements noted in Neurologic exam below  NEUROLOGIC: MENTAL STATUS:  No flowsheet data found.  awake, alert, oriented to person, place and time  recent and remote memory intact  normal attention and concentration  language fluent, comprehension intact, naming intact,   fund of knowledge appropriate  CRANIAL NERVE:   2nd - no papilledema on fundoscopic exam  2nd, 3rd, 4th, 6th - pupils equal and reactive to light, visual fields full to confrontation, extraocular muscles intact, no nystagmus  5th - facial sensation symmetric  7th - facial strength symmetric  8th - hearing intact  9th - palate elevates symmetrically, uvula midline  11th - shoulder shrug symmetric  12th - tongue protrusion midline  MOTOR:   normal bulk and tone, full strength in the BUE,  BLE  SENSORY:   normal and symmetric to light touch, pinprick, temperature, vibration  COORDINATION:   finger-nose-finger, fine finger movements normal  REFLEXES:   deep tendon reflexes present and symmetric  GAIT/STATION:   narrow based gait; able to walk tandem; romberg is negative    DIAGNOSTIC DATA (LABS, IMAGING, TESTING) - I reviewed patient records, labs, notes, testing and imaging myself where available.  Lab Results  Component Value Date   WBC 10.8 08/26/2014   HGB 12.1 08/26/2014   HCT 37.0 08/26/2014   MCV  86.8 08/26/2014   PLT 314 08/26/2014      Component Value Date/Time   NA 143 08/26/2014 2020   K 4.3 08/26/2014 2020   CL 103 08/26/2014 2020   CO2 29 08/26/2014 2020   GLUCOSE 114* 08/26/2014 2020   BUN 11 08/26/2014 2020   CREATININE 0.69 08/26/2014 2020   CALCIUM 9.3 08/26/2014 2020   GFRNONAA >60 08/26/2014 2020   GFRAA >60 08/26/2014 2020   No results found for: CHOL, HDL, LDLCALC, LDLDIRECT, TRIG, CHOLHDL No results found for: HGBA1C No results found for: VITAMINB12 No results found for: TSH  06/08/13 carotid u/s  - Mild plaque formation is seen involving the proximal portions of both internal carotid arteries consistent with less than 50% diameter stenosis based on ultrasound and Doppler criteria.     ASSESSMENT AND PLAN  64 y.o. year old female here with intermittent numbness and tingling in left face, bilateral hands and feet. May represent diabetic neuropathy phenomenon. Alternatively could represent CNS autoimmune, inflammatory, vascular etiologies. Peripheral nerve compression also possible. We'll check further workup with MRI of the brain.  Ddx: diabetic neuropathy, CNS inflamm/autoimmune, vascular, metabolic   PLAN: - MRI brain   Orders Placed This Encounter  Procedures  . MR Brain W Wo Contrast   Return in about 3 months (around 01/08/2015).    Penni Bombard, MD 1/91/6606, 00:45 AM Certified in Neurology,  Neurophysiology and Neuroimaging  [facility] [address] [place], [place] [address] [phone]

## 2014-11-19 ENCOUNTER — Ambulatory Visit
Admission: RE | Admit: 2014-11-19 | Discharge: 2014-11-19 | Disposition: A | Discharge status: Home / Self Care | Payer: Medicare Other | Source: Ambulatory Visit | Attending: Diagnostic Neuroimaging | Admitting: Diagnostic Neuroimaging

## 2014-11-19 DIAGNOSIS — R200 Anesthesia of skin: Secondary | ICD-10-CM | POA: Diagnosis not present

## 2014-11-19 DIAGNOSIS — R202 Paresthesia of skin: Secondary | ICD-10-CM

## 2014-11-19 MED ORDER — GADOBENATE DIMEGLUMINE 529 MG/ML IV SOLN
20.0000 mL | Freq: Once | INTRAVENOUS | Status: AC | PRN
  Administered 2014-11-19: 16 mL via INTRAVENOUS

## 2014-12-05 DIAGNOSIS — E041 Nontoxic single thyroid nodule: Secondary | ICD-10-CM | POA: Diagnosis not present

## 2015-01-08 ENCOUNTER — Ambulatory Visit (INDEPENDENT_AMBULATORY_CARE_PROVIDER_SITE_OTHER): Payer: Medicare Other | Admitting: Diagnostic Neuroimaging

## 2015-01-08 ENCOUNTER — Encounter: Payer: Self-pay | Admitting: Diagnostic Neuroimaging

## 2015-01-08 VITALS — BP 107/70 | HR 67 | Ht 61.0 in | Wt 172.4 lb

## 2015-01-08 DIAGNOSIS — R202 Paresthesia of skin: Secondary | ICD-10-CM

## 2015-01-08 DIAGNOSIS — R200 Anesthesia of skin: Secondary | ICD-10-CM

## 2015-01-08 NOTE — Progress Notes (Signed)
[facility]  PATIENT: Paula [name] DOB: [birth year]  REFERRING CLINICIAN: Pharr HISTORY FROM: patient  REASON FOR VISIT: follow up   HISTORICAL  CHIEF COMPLAINT:  Chief Complaint  Patient presents with  . Numbness    rm 6, "tingling left side of face"  . Follow-up    3 month    HISTORY OF PRESENT ILLNESS:   UPDATE 01/08/15: Continues with left mouth numbness, intermittent, every few weeks. Also with left eye twitching. No weakness or vision changes.  PRIOR HPI (10/08/14): 64 year old right handed female with hypertension, diabetes, hypercholesterolemia, anxiety, thyroiditis, mild apnea, here for evaluation of burning sensation in face, hands and feet. Patient has had intermittent left face numbness, tingling sensation, intermittent, lasting for a few minutes at a time. Sometimes it feels like a pulling sensation. This is happening 6-7 times per year over the past 5 years. Over past few months patient has had intermittent numbness and burning pain in her right heel, sometimes left heel, as well as intermittent tingling in fingers and toes. Patient was diagnosed with diabetes in 2009, at that time her A1c was around 12. Over time her diabetes control has gradually improved. Was recent A1c was 6.0.   REVIEW OF SYSTEMS: Full 14 system review of systems performed and notable only for trouble swallowing rash snoring easy bruising feeling hot joint pain confusion numbness diff swallowing anxiety.   ALLERGIES: Allergies  Allergen Reactions  . Tetracyclines & Related     Upset stomach    HOME MEDICATIONS: Outpatient Prescriptions Prior to Visit  Medication Sig Dispense Refill  . aspirin 81 MG tablet Take 81 mg by mouth daily.    . Calcium Citrate-Vitamin D (CALCIUM + D PO) Take by mouth.    . levothyroxine (SYNTHROID, LEVOTHROID) 50 MCG tablet Take 50 mcg by mouth daily before breakfast.    . lisinopril (PRINIVIL,ZESTRIL) 10 MG tablet Take by mouth.    .  metformin (FORTAMET) 1000 MG (OSM) 24 hr tablet Take 1,000 mg by mouth 2 (two) times daily with a meal.     . NON FORMULARY DHA+EPA Purity vitamin    . NON FORMULARY Immune health blend    . Omega-3 Fatty Acids (FISH OIL PO) Take by mouth.    . pantoprazole (PROTONIX) 40 MG tablet Take by mouth.    . rosuvastatin (CRESTOR) 10 MG tablet Take 5 mg by mouth daily.    . sertraline (ZOLOFT) 50 MG tablet Take 50 mg by mouth daily.     Marland Kitchen triamcinolone cream (KENALOG) 0.1 % Apply 1 application topically as needed.    . Lactobacillus Rhamnosus, GG, (CULTURELLE PO) Take by mouth.     No facility-administered medications prior to visit.    PAST MEDICAL HISTORY: Past Medical History  Diagnosis Date  . Aortic valve insufficiency   . Osteoarthritis   . Varicose vein   . Family history of heart disease     mother, son, and grandparent  . Hyperlipidemia   . Diabetes ([place])     type II  . HTN (hypertension)   . Hiatal hernia   . GERD (gastroesophageal reflux disease)   . History of small bowel obstruction   . Palpitations   . Paresthesias     left-sided  . Thyroid nodule     PAST SURGICAL HISTORY: Past Surgical History  Procedure Laterality Date  . Gallbladder surgery  1974  . Tubal ligation  1990  . Umbilical hernia repair  1997  . Abdominal hysterectomy  2003  . Colon surgery  2005    colon blockage due to adhesions  . Hernia repair  2003    incisional    FAMILY HISTORY: Family History  Problem Relation Age of Onset  . Cancer Mother     lung  . Heart attack Mother     age 17  . Stroke Father     "mini strokes"  . Cancer Father     prostate  . Diabetes Maternal Grandmother   . Stroke Maternal Grandmother   . Hypertension Maternal Grandmother   . Heart disease Paternal Grandfather   . Hypertension Brother   . Hyperlipidemia Brother   . Heart attack Son     at age 17    SOCIAL HISTORY:  Social History   Social History  . Marital Status: Married    Spouse Name:  Fritz Pickerel  . Number of Children: 3  . Years of Education: 12   Occupational History  . retired     Marketing executive job   Social History Main Topics  . Smoking status: Former Smoker -- 3.00 packs/day for 26 years    Quit date: 02/22/1986  . Smokeless tobacco: Not on file  . Alcohol Use: No     Comment: "seldom"  . Drug Use: No  . Sexual Activity: Not on file   Other Topics Concern  . Not on file   Social History Narrative   Lives at home with husband   Caffeine use- 1-2 cups coffee     PHYSICAL EXAM  GENERAL EXAM/CONSTITUTIONAL: Vitals:  Filed Vitals:   01/08/15 1052  BP: 107/70  Pulse: 67  Height: 5\' 1"  (1.549 m)  Weight: 172 lb 6.4 oz (78.2 kg)   Body mass index is 32.59 kg/(m^2). No exam data present  Patient is in no distress; well developed, nourished and groomed; neck is supple  CARDIOVASCULAR:  Examination of carotid arteries is normal; no carotid bruits  Regular rate and rhythm, no murmurs  Examination of peripheral vascular system by observation and palpation is normal  EYES:  Ophthalmoscopic exam of optic discs and posterior segments is normal; no papilledema or hemorrhages  MUSCULOSKELETAL:  Gait, strength, tone, movements noted in Neurologic exam below  NEUROLOGIC: MENTAL STATUS:  No flowsheet data found.  awake, alert, oriented to person, place and time  recent and remote memory intact  normal attention and concentration  language fluent, comprehension intact, naming intact,   fund of knowledge appropriate  CRANIAL NERVE:   2nd - no papilledema on fundoscopic exam  2nd, 3rd, 4th, 6th - pupils equal and reactive to light, visual fields full to confrontation, extraocular muscles intact, no nystagmus  5th - facial sensation symmetric  7th - facial strength symmetric  8th - hearing intact  9th - palate elevates symmetrically, uvula midline  11th - shoulder shrug symmetric  12th - tongue protrusion midline  MOTOR:   normal bulk and  tone, full strength in the BUE, BLE  SENSORY:   normal and symmetric to light touch, pinprick, temperature, vibration  COORDINATION:   finger-nose-finger, fine finger movements normal  REFLEXES:   deep tendon reflexes present and symmetric  GAIT/STATION:   narrow based gait; able to walk tandem; romberg is negative    DIAGNOSTIC DATA (LABS, IMAGING, TESTING) - I reviewed patient records, labs, notes, testing and imaging myself where available.  Lab Results  Component Value Date   WBC 10.8 08/26/2014   HGB 12.1 08/26/2014   HCT 37.0 08/26/2014   MCV 86.8  08/26/2014   PLT 314 08/26/2014      Component Value Date/Time   NA 143 08/26/2014 2020   K 4.3 08/26/2014 2020   CL 103 08/26/2014 2020   CO2 29 08/26/2014 2020   GLUCOSE 114* 08/26/2014 2020   BUN 11 08/26/2014 2020   CREATININE 0.69 08/26/2014 2020   CALCIUM 9.3 08/26/2014 2020   GFRNONAA >60 08/26/2014 2020   GFRAA >60 08/26/2014 2020   No results found for: CHOL, HDL, LDLCALC, LDLDIRECT, TRIG, CHOLHDL No results found for: HGBA1C No results found for: VITAMINB12 No results found for: TSH  06/08/13 carotid u/s  - Mild plaque formation is seen involving the proximal portions of both internal carotid arteries consistent with less than 50% diameter stenosis based on ultrasound and Doppler criteria.   08/09/13 TTE  - Normal LV function; grade 1 diastolic dysfunction; mild TR.  11/19/14 MRI brain (with and without) demonstrating: 1. Scattered round and ovoid, periventricular and subcortical and juxtacortical T2 hyperintensities. Larger foci noted in the right parietal and left frontal regions. Some of these are hypointense on T1 views. No abnormal lesions are seen on post contrast views. Considerations include chronic ischemic, autoimmune, inflammatory or post-infectious etiologies.  2. No acute findings.     ASSESSMENT AND PLAN  64 y.o. year old female here with intermittent numbness and tingling in left  face, bilateral hands and feet. May represent diabetic neuropathy phenomenon. Alternatively could represent CNS autoimmune, inflammatory or vascular etiologies. Peripheral nerve compression also possible.   MRI brain shows non-specific T2 hyperintense lesions, likely related to chronic small vessel ischemic disease. Multiple sclerosis or other autoimmune causes less likely.   Ddx: diabetic neuropathy, CNS inflamm/autoimmune, vascular, metabolic    PLAN: - monitor symptoms - continue aspirin metformin, statin, lisinopril  Return if symptoms worsen or fail to improve, for return to PCP.    Penni Bombard, MD [id number], 0000000 AM Certified in Neurology, Neurophysiology and Neuroimaging  [facility] [address] [place], [place] [address] [phone]

## 2015-01-08 NOTE — Patient Instructions (Signed)

## 2015-01-31 DIAGNOSIS — J069 Acute upper respiratory infection, unspecified: Secondary | ICD-10-CM | POA: Diagnosis not present

## 2015-03-21 ENCOUNTER — Other Ambulatory Visit: Payer: Self-pay | Admitting: Endocrinology

## 2015-03-21 DIAGNOSIS — E041 Nontoxic single thyroid nodule: Secondary | ICD-10-CM

## 2015-04-11 ENCOUNTER — Other Ambulatory Visit: Payer: Self-pay

## 2015-04-11 DIAGNOSIS — Z1231 Encounter for screening mammogram for malignant neoplasm of breast: Secondary | ICD-10-CM

## 2015-04-15 DIAGNOSIS — E7800 Pure hypercholesterolemia, unspecified: Secondary | ICD-10-CM | POA: Diagnosis not present

## 2015-04-15 DIAGNOSIS — E119 Type 2 diabetes mellitus without complications: Secondary | ICD-10-CM | POA: Diagnosis not present

## 2015-04-15 DIAGNOSIS — I10 Essential (primary) hypertension: Secondary | ICD-10-CM | POA: Diagnosis not present

## 2015-04-17 DIAGNOSIS — I10 Essential (primary) hypertension: Secondary | ICD-10-CM | POA: Diagnosis not present

## 2015-04-17 DIAGNOSIS — E119 Type 2 diabetes mellitus without complications: Secondary | ICD-10-CM | POA: Diagnosis not present

## 2015-04-17 DIAGNOSIS — E7800 Pure hypercholesterolemia, unspecified: Secondary | ICD-10-CM | POA: Diagnosis not present

## 2015-04-23 DIAGNOSIS — R6889 Other general symptoms and signs: Secondary | ICD-10-CM | POA: Diagnosis not present

## 2015-05-27 ENCOUNTER — Other Ambulatory Visit (INDEPENDENT_AMBULATORY_CARE_PROVIDER_SITE_OTHER): Payer: Self-pay | Admitting: Otolaryngology

## 2015-05-27 DIAGNOSIS — R1310 Dysphagia, unspecified: Secondary | ICD-10-CM

## 2015-05-27 DIAGNOSIS — K219 Gastro-esophageal reflux disease without esophagitis: Secondary | ICD-10-CM | POA: Diagnosis not present

## 2015-05-27 DIAGNOSIS — R1312 Dysphagia, oropharyngeal phase: Secondary | ICD-10-CM | POA: Diagnosis not present

## 2015-05-29 ENCOUNTER — Ambulatory Visit: Payer: Medicare Other

## 2015-06-02 ENCOUNTER — Ambulatory Visit (HOSPITAL_COMMUNITY): Payer: Medicare Other

## 2015-06-03 ENCOUNTER — Ambulatory Visit
Admission: RE | Admit: 2015-06-03 | Discharge: 2015-06-03 | Disposition: A | Discharge status: Home / Self Care | Payer: Medicare Other | Source: Ambulatory Visit

## 2015-06-03 DIAGNOSIS — Z1231 Encounter for screening mammogram for malignant neoplasm of breast: Secondary | ICD-10-CM | POA: Diagnosis not present

## 2015-06-05 ENCOUNTER — Ambulatory Visit (HOSPITAL_COMMUNITY)
Admission: RE | Admit: 2015-06-05 | Discharge: 2015-06-05 | Disposition: A | Discharge status: Home / Self Care | Payer: Medicare Other | Source: Ambulatory Visit | Attending: Otolaryngology | Admitting: Otolaryngology

## 2015-06-05 DIAGNOSIS — R1310 Dysphagia, unspecified: Secondary | ICD-10-CM | POA: Diagnosis not present

## 2015-06-05 DIAGNOSIS — K224 Dyskinesia of esophagus: Secondary | ICD-10-CM | POA: Insufficient documentation

## 2015-06-05 DIAGNOSIS — K449 Diaphragmatic hernia without obstruction or gangrene: Secondary | ICD-10-CM | POA: Diagnosis not present

## 2015-06-24 DIAGNOSIS — R1312 Dysphagia, oropharyngeal phase: Secondary | ICD-10-CM | POA: Diagnosis not present

## 2015-06-24 DIAGNOSIS — K219 Gastro-esophageal reflux disease without esophagitis: Secondary | ICD-10-CM | POA: Diagnosis not present

## 2015-07-24 DIAGNOSIS — Z1389 Encounter for screening for other disorder: Secondary | ICD-10-CM | POA: Diagnosis not present

## 2015-07-24 DIAGNOSIS — Z0000 Encounter for general adult medical examination without abnormal findings: Secondary | ICD-10-CM | POA: Diagnosis not present

## 2015-07-24 DIAGNOSIS — E119 Type 2 diabetes mellitus without complications: Secondary | ICD-10-CM | POA: Diagnosis not present

## 2015-07-24 DIAGNOSIS — I10 Essential (primary) hypertension: Secondary | ICD-10-CM | POA: Diagnosis not present

## 2015-07-31 DIAGNOSIS — Z0000 Encounter for general adult medical examination without abnormal findings: Secondary | ICD-10-CM | POA: Diagnosis not present

## 2015-07-31 DIAGNOSIS — E7800 Pure hypercholesterolemia, unspecified: Secondary | ICD-10-CM | POA: Diagnosis not present

## 2015-07-31 DIAGNOSIS — Z1159 Encounter for screening for other viral diseases: Secondary | ICD-10-CM | POA: Diagnosis not present

## 2015-07-31 DIAGNOSIS — Z8669 Personal history of other diseases of the nervous system and sense organs: Secondary | ICD-10-CM | POA: Diagnosis not present

## 2015-07-31 DIAGNOSIS — I10 Essential (primary) hypertension: Secondary | ICD-10-CM | POA: Diagnosis not present

## 2015-07-31 DIAGNOSIS — Z79899 Other long term (current) drug therapy: Secondary | ICD-10-CM | POA: Diagnosis not present

## 2015-07-31 DIAGNOSIS — M8580 Other specified disorders of bone density and structure, unspecified site: Secondary | ICD-10-CM | POA: Diagnosis not present

## 2015-07-31 DIAGNOSIS — E559 Vitamin D deficiency, unspecified: Secondary | ICD-10-CM | POA: Diagnosis not present

## 2015-09-02 DIAGNOSIS — K219 Gastro-esophageal reflux disease without esophagitis: Secondary | ICD-10-CM | POA: Diagnosis not present

## 2015-09-02 DIAGNOSIS — R1312 Dysphagia, oropharyngeal phase: Secondary | ICD-10-CM | POA: Diagnosis not present

## 2015-09-04 DIAGNOSIS — Z124 Encounter for screening for malignant neoplasm of cervix: Secondary | ICD-10-CM | POA: Diagnosis not present

## 2015-09-04 DIAGNOSIS — Z01419 Encounter for gynecological examination (general) (routine) without abnormal findings: Secondary | ICD-10-CM | POA: Diagnosis not present

## 2015-09-18 ENCOUNTER — Ambulatory Visit
Admission: RE | Admit: 2015-09-18 | Discharge: 2015-09-18 | Disposition: A | Discharge status: Home / Self Care | Payer: Medicare Other | Source: Ambulatory Visit | Attending: Endocrinology | Admitting: Endocrinology

## 2015-09-18 DIAGNOSIS — E041 Nontoxic single thyroid nodule: Secondary | ICD-10-CM

## 2015-09-25 DIAGNOSIS — E063 Autoimmune thyroiditis: Secondary | ICD-10-CM | POA: Diagnosis not present

## 2015-09-25 DIAGNOSIS — E041 Nontoxic single thyroid nodule: Secondary | ICD-10-CM | POA: Diagnosis not present

## 2015-10-30 DIAGNOSIS — E119 Type 2 diabetes mellitus without complications: Secondary | ICD-10-CM | POA: Diagnosis not present

## 2015-12-08 DIAGNOSIS — M791 Myalgia: Secondary | ICD-10-CM | POA: Diagnosis not present

## 2015-12-08 DIAGNOSIS — M2550 Pain in unspecified joint: Secondary | ICD-10-CM | POA: Diagnosis not present

## 2015-12-08 DIAGNOSIS — G5700 Lesion of sciatic nerve, unspecified lower limb: Secondary | ICD-10-CM | POA: Diagnosis not present

## 2015-12-08 DIAGNOSIS — M7060 Trochanteric bursitis, unspecified hip: Secondary | ICD-10-CM | POA: Diagnosis not present

## 2015-12-22 DIAGNOSIS — K648 Other hemorrhoids: Secondary | ICD-10-CM | POA: Diagnosis not present

## 2015-12-22 DIAGNOSIS — K5730 Diverticulosis of large intestine without perforation or abscess without bleeding: Secondary | ICD-10-CM | POA: Diagnosis not present

## 2015-12-22 DIAGNOSIS — D122 Benign neoplasm of ascending colon: Secondary | ICD-10-CM | POA: Diagnosis not present

## 2015-12-22 DIAGNOSIS — D126 Benign neoplasm of colon, unspecified: Secondary | ICD-10-CM | POA: Diagnosis not present

## 2015-12-22 DIAGNOSIS — Z86010 Personal history of colonic polyps: Secondary | ICD-10-CM | POA: Diagnosis not present

## 2015-12-25 DIAGNOSIS — D126 Benign neoplasm of colon, unspecified: Secondary | ICD-10-CM | POA: Diagnosis not present

## 2016-05-28 DIAGNOSIS — R0781 Pleurodynia: Secondary | ICD-10-CM | POA: Diagnosis not present

## 2016-05-28 DIAGNOSIS — M25512 Pain in left shoulder: Secondary | ICD-10-CM | POA: Diagnosis not present

## 2016-07-07 DIAGNOSIS — K219 Gastro-esophageal reflux disease without esophagitis: Secondary | ICD-10-CM | POA: Diagnosis not present

## 2016-07-07 DIAGNOSIS — R070 Pain in throat: Secondary | ICD-10-CM | POA: Diagnosis not present

## 2016-08-09 DIAGNOSIS — E119 Type 2 diabetes mellitus without complications: Secondary | ICD-10-CM | POA: Diagnosis not present

## 2016-08-09 DIAGNOSIS — Z7982 Long term (current) use of aspirin: Secondary | ICD-10-CM | POA: Diagnosis not present

## 2016-08-09 DIAGNOSIS — I10 Essential (primary) hypertension: Secondary | ICD-10-CM | POA: Diagnosis not present

## 2016-08-09 DIAGNOSIS — M8580 Other specified disorders of bone density and structure, unspecified site: Secondary | ICD-10-CM | POA: Diagnosis not present

## 2016-08-09 DIAGNOSIS — E7800 Pure hypercholesterolemia, unspecified: Secondary | ICD-10-CM | POA: Diagnosis not present

## 2016-08-12 DIAGNOSIS — I8390 Asymptomatic varicose veins of unspecified lower extremity: Secondary | ICD-10-CM | POA: Diagnosis not present

## 2016-08-12 DIAGNOSIS — M159 Polyosteoarthritis, unspecified: Secondary | ICD-10-CM | POA: Diagnosis not present

## 2016-08-12 DIAGNOSIS — Z0000 Encounter for general adult medical examination without abnormal findings: Secondary | ICD-10-CM | POA: Diagnosis not present

## 2016-08-12 DIAGNOSIS — I351 Nonrheumatic aortic (valve) insufficiency: Secondary | ICD-10-CM | POA: Diagnosis not present

## 2016-08-23 DIAGNOSIS — M25561 Pain in right knee: Secondary | ICD-10-CM | POA: Diagnosis not present

## 2016-08-23 DIAGNOSIS — M160 Bilateral primary osteoarthritis of hip: Secondary | ICD-10-CM | POA: Diagnosis not present

## 2016-08-23 DIAGNOSIS — M1611 Unilateral primary osteoarthritis, right hip: Secondary | ICD-10-CM | POA: Diagnosis not present

## 2016-08-23 DIAGNOSIS — M5136 Other intervertebral disc degeneration, lumbar region: Secondary | ICD-10-CM | POA: Diagnosis not present

## 2016-08-23 DIAGNOSIS — M7061 Trochanteric bursitis, right hip: Secondary | ICD-10-CM | POA: Diagnosis not present

## 2016-08-23 DIAGNOSIS — M25551 Pain in right hip: Secondary | ICD-10-CM | POA: Diagnosis not present

## 2016-08-23 DIAGNOSIS — M76891 Other specified enthesopathies of right lower limb, excluding foot: Secondary | ICD-10-CM | POA: Diagnosis not present

## 2016-08-23 DIAGNOSIS — M533 Sacrococcygeal disorders, not elsewhere classified: Secondary | ICD-10-CM | POA: Diagnosis not present

## 2016-08-23 DIAGNOSIS — G8929 Other chronic pain: Secondary | ICD-10-CM | POA: Diagnosis not present

## 2016-09-08 DIAGNOSIS — M25512 Pain in left shoulder: Secondary | ICD-10-CM | POA: Diagnosis not present

## 2016-09-09 DIAGNOSIS — M25512 Pain in left shoulder: Secondary | ICD-10-CM | POA: Diagnosis not present

## 2016-09-14 DIAGNOSIS — S46012D Strain of muscle(s) and tendon(s) of the rotator cuff of left shoulder, subsequent encounter: Secondary | ICD-10-CM | POA: Diagnosis not present

## 2016-09-20 DIAGNOSIS — M1611 Unilateral primary osteoarthritis, right hip: Secondary | ICD-10-CM | POA: Diagnosis not present

## 2016-09-20 DIAGNOSIS — M5136 Other intervertebral disc degeneration, lumbar region: Secondary | ICD-10-CM | POA: Diagnosis not present

## 2016-09-20 DIAGNOSIS — G8929 Other chronic pain: Secondary | ICD-10-CM | POA: Diagnosis not present

## 2016-09-20 DIAGNOSIS — M25561 Pain in right knee: Secondary | ICD-10-CM | POA: Diagnosis not present

## 2016-09-22 DIAGNOSIS — M25512 Pain in left shoulder: Secondary | ICD-10-CM | POA: Diagnosis not present

## 2016-09-23 DIAGNOSIS — E041 Nontoxic single thyroid nodule: Secondary | ICD-10-CM | POA: Diagnosis not present

## 2016-09-24 DIAGNOSIS — M25512 Pain in left shoulder: Secondary | ICD-10-CM | POA: Diagnosis not present

## 2016-09-29 DIAGNOSIS — M25512 Pain in left shoulder: Secondary | ICD-10-CM | POA: Diagnosis not present

## 2016-09-30 DIAGNOSIS — E063 Autoimmune thyroiditis: Secondary | ICD-10-CM | POA: Diagnosis not present

## 2016-09-30 DIAGNOSIS — E041 Nontoxic single thyroid nodule: Secondary | ICD-10-CM | POA: Diagnosis not present

## 2016-10-01 DIAGNOSIS — M25512 Pain in left shoulder: Secondary | ICD-10-CM | POA: Diagnosis not present

## 2016-10-06 DIAGNOSIS — M25512 Pain in left shoulder: Secondary | ICD-10-CM | POA: Diagnosis not present

## 2016-10-08 DIAGNOSIS — M25512 Pain in left shoulder: Secondary | ICD-10-CM | POA: Diagnosis not present

## 2016-10-13 DIAGNOSIS — M25512 Pain in left shoulder: Secondary | ICD-10-CM | POA: Diagnosis not present

## 2016-10-14 DIAGNOSIS — M1611 Unilateral primary osteoarthritis, right hip: Secondary | ICD-10-CM | POA: Diagnosis not present

## 2016-10-15 DIAGNOSIS — M25512 Pain in left shoulder: Secondary | ICD-10-CM | POA: Diagnosis not present

## 2016-10-21 DIAGNOSIS — M1611 Unilateral primary osteoarthritis, right hip: Secondary | ICD-10-CM | POA: Diagnosis not present

## 2016-10-21 DIAGNOSIS — M25551 Pain in right hip: Secondary | ICD-10-CM | POA: Diagnosis not present

## 2016-10-21 DIAGNOSIS — N390 Urinary tract infection, site not specified: Secondary | ICD-10-CM | POA: Diagnosis not present

## 2016-10-21 DIAGNOSIS — Z01818 Encounter for other preprocedural examination: Secondary | ICD-10-CM | POA: Diagnosis not present

## 2016-10-28 NOTE — Progress Notes (Signed)
Please place orders in EPIC as patient is being scheduled for a pre-op appointment! Thank you! 

## 2016-10-29 ENCOUNTER — Ambulatory Visit: Payer: Self-pay | Admitting: Orthopedic Surgery

## 2016-11-03 ENCOUNTER — Ambulatory Visit: Payer: Self-pay | Admitting: Orthopedic Surgery

## 2016-11-03 DIAGNOSIS — E875 Hyperkalemia: Secondary | ICD-10-CM | POA: Diagnosis not present

## 2016-11-03 NOTE — H&P (Signed)
TOTAL HIP ADMISSION H&P  Patient is admitted for right total hip arthroplasty.  Subjective:  Chief Complaint: right hip pain  HPI: Paula [name], 66 y.o. female, has a history of pain and functional disability in the right hip(s) due to arthritis and patient has failed non-surgical conservative treatments for greater than 12 weeks to include NSAID's and/or analgesics, flexibility and strengthening excercises, use of assistive devices, weight reduction as appropriate and activity modification.  Onset of symptoms was gradual starting 2 years ago with gradually worsening course since that time.The patient noted no past surgery on the right hip(s).  Patient currently rates pain in the right hip at 10 out of 10 with activity. Patient has night pain, worsening of pain with activity and weight bearing, trendelenberg gait, pain that interfers with activities of daily living, pain with passive range of motion and crepitus. Patient has evidence of subchondral cysts, subchondral sclerosis, periarticular osteophytes and joint space narrowing by imaging studies. This condition presents safety issues increasing the risk of falls. There is no current active infection.  Patient Active Problem List   Diagnosis Date Noted  . Essential hypertension 08/01/2013  . Hyperlipidemia 08/01/2013  . Diabetes ([place]) 08/01/2013  . Sleep apnea 08/01/2013  . Morbid obesity ([place]) 08/01/2013  . Palpitations 08/01/2013  . Paresthesias 08/01/2013   Past Medical History:  Diagnosis Date  . Aortic valve insufficiency   . Diabetes ([place])    type II  . Family history of heart disease    mother, son, and grandparent  . GERD (gastroesophageal reflux disease)   . Hiatal hernia   . History of small bowel obstruction   . HTN (hypertension)   . Hyperlipidemia   . Osteoarthritis   . Palpitations   . Paresthesias    left-sided  . Thyroid nodule   . Varicose vein     Past Surgical History:  Procedure Laterality Date  .  ABDOMINAL HYSTERECTOMY  2003  . COLON SURGERY  2005   colon blockage due to adhesions  . GALLBLADDER SURGERY  1974  . HERNIA REPAIR  2003   incisional  . TUBAL LIGATION  1990  . UMBILICAL HERNIA REPAIR  1997     (Not in a hospital admission) Allergies  Allergen Reactions  . Tetracyclines & Related     Upset stomach    Social History  Substance Use Topics  . Smoking status: Former Smoker    Packs/day: 3.00    Years: 26.00    Quit date: 02/22/1986  . Smokeless tobacco: Not on file  . Alcohol use No     Comment: "seldom"    Family History  Problem Relation Age of Onset  . Cancer Mother        lung  . Heart attack Mother        age 77  . Stroke Father        "mini strokes"  . Cancer Father        prostate  . Diabetes Maternal Grandmother   . Stroke Maternal Grandmother   . Hypertension Maternal Grandmother   . Heart disease Paternal Grandfather   . Hypertension Brother   . Hyperlipidemia Brother   . Heart attack Son        at age 18     Review of Systems  Constitutional: Negative.   HENT: Negative.   Eyes: Negative.   Respiratory: Negative.   Cardiovascular: Negative.   Gastrointestinal: Negative.   Genitourinary: Negative.   Musculoskeletal: Positive for joint pain.  Skin: Positive for itching and rash.  Neurological: Negative.   Endo/Heme/Allergies: Negative.   Psychiatric/Behavioral: Negative.     Objective:  Physical Exam  Vitals reviewed. Constitutional: She is oriented to person, place, and time. She appears well-developed and well-nourished.  HENT:  Head: Normocephalic and atraumatic.  Eyes: Pupils are equal, round, and reactive to light. Conjunctivae and EOM are normal.  Neck: Normal range of motion. Neck supple.  Cardiovascular: Normal rate, regular rhythm and intact distal pulses.   Respiratory: Effort normal and breath sounds normal.  GI: Soft. She exhibits no distension.  Genitourinary:  Genitourinary Comments: deferred   Musculoskeletal:       Right hip: She exhibits decreased range of motion and bony tenderness.  Neurological: She is alert and oriented to person, place, and time. She has normal reflexes.  Skin: Skin is warm and dry.  Psychiatric: She has a normal mood and affect. Her behavior is normal. Judgment and thought content normal.    Vital signs in last 24 hours: @VSRANGES @  Labs:   Estimated body mass index is 32.57 kg/m as calculated from the following:   Height as of 01/08/15: 5\' 1"  (1.549 m).   Weight as of 01/08/15: 78.2 kg (172 lb 6.4 oz).   Imaging Review Plain radiographs demonstrate severe degenerative joint disease of the right hip(s). The bone quality appears to be adequate for age and reported activity level.  Assessment/Plan:  End stage arthritis, right hip(s)  The patient history, physical examination, clinical judgement of the provider and imaging studies are consistent with end stage degenerative joint disease of the right hip(s) and total hip arthroplasty is deemed medically necessary. The treatment options including medical management, injection therapy, arthroscopy and arthroplasty were discussed at length. The risks and benefits of total hip arthroplasty were presented and reviewed. The risks due to aseptic loosening, infection, stiffness, dislocation/subluxation,  thromboembolic complications and other imponderables were discussed.  The patient acknowledged the explanation, agreed to proceed with the plan and consent was signed. Patient is being admitted for inpatient treatment for surgery, pain control, PT, OT, prophylactic antibiotics, VTE prophylaxis, progressive ambulation and ADL's and discharge planning.The patient is planning to be discharged home with home health services vs [facility]

## 2016-11-16 ENCOUNTER — Ambulatory Visit (HOSPITAL_COMMUNITY)
Admission: RE | Admit: 2016-11-16 | Discharge: 2016-11-16 | Disposition: A | Discharge status: Home / Self Care | Payer: Medicare Other | Source: Ambulatory Visit | Attending: Vascular Surgery | Admitting: Vascular Surgery

## 2016-11-16 ENCOUNTER — Other Ambulatory Visit (HOSPITAL_COMMUNITY): Payer: Self-pay | Admitting: Internal Medicine

## 2016-11-16 DIAGNOSIS — I8001 Phlebitis and thrombophlebitis of superficial vessels of right lower extremity: Secondary | ICD-10-CM | POA: Insufficient documentation

## 2016-11-16 DIAGNOSIS — M7121 Synovial cyst of popliteal space [Baker], right knee: Secondary | ICD-10-CM | POA: Insufficient documentation

## 2016-11-16 DIAGNOSIS — Z23 Encounter for immunization: Secondary | ICD-10-CM | POA: Diagnosis not present

## 2016-11-17 ENCOUNTER — Encounter (HOSPITAL_COMMUNITY): Payer: Self-pay

## 2016-11-17 NOTE — Patient Instructions (Addendum)
Paula [name]  11/17/2016   Your procedure is scheduled on: 11-25-16  Report to [facility] Main  Entrance Take [place] Elevators to 3rd floor to  [place] at 5:30 AM.   Call this number if you have problems the morning of surgery [phone]    Remember: ONLY 1 PERSON MAY GO WITH YOU TO SHORT STAY TO GET  READY MORNING OF [place].  Do not eat food or drink liquids :After Midnight.     Take these medicines the morning of surgery with A SIP OF WATER: Levothyroxine (Synthroid),  and Sertraline (Zoloft) DO NOT TAKE ANY DIABETIC MEDICATIONS DAY OF YOUR SURGERY                               You may not have any metal on your body including hair pins and              piercings  Do not wear jewelry, make-up, lotions, powders or perfumes, deodorant             Do not wear nail polish.  Do not shave  48 hours prior to surgery.                           Do not bring valuables to the hospital. [place].  Contacts, dentures or bridgework may not be worn into surgery.  Leave suitcase in the car. After surgery it may be brought to your room.                 Please read over the following fact sheets you were given: _____________________________________________________________________  How to Manage Your Diabetes Before and After Surgery  Why is it important to control my blood sugar before and after surgery? . Improving blood sugar levels before and after surgery helps healing and can limit problems. . A way of improving blood sugar control is eating a healthy diet by: o  Eating less sugar and carbohydrates o  Increasing activity/exercise o  Talking with your doctor about reaching your blood sugar goals . High blood sugars (greater than 180 mg/dL) can raise your risk of infections and slow your recovery, so you will need to focus on controlling your diabetes during the weeks before surgery. . Make sure  that the doctor who takes care of your diabetes knows about your planned surgery including the date and location.  How do I manage my blood sugar before surgery? . Check your blood sugar at least 4 times a day, starting 2 days before surgery, to make sure that the level is not too high or low. o Check your blood sugar the morning of your surgery when you wake up and every 2 hours until you get to the Short Stay unit. . If your blood sugar is less than 70 mg/dL, you will need to treat for low blood sugar: o Do not take insulin. o Treat a low blood sugar (less than 70 mg/dL) with  cup of clear juice (cranberry or apple), 4 glucose tablets, OR glucose gel. o Recheck blood sugar in 15 minutes after treatment (to make sure it is greater than 70 mg/dL). If your blood sugar is not greater  than 70 mg/dL on recheck, call [phone] for further instructions. . Report your blood sugar to the short stay nurse when you get to Short Stay.  . If you are admitted to the hospital after surgery: o Your blood sugar will be checked by the staff and you will probably be given insulin after surgery (instead of oral diabetes medicines) to make sure you have good blood sugar levels. o The goal for blood sugar control after surgery is 80-180 mg/dL.   WHAT DO I DO ABOUT MY DIABETES MEDICATION?  Marland Kitchen Do not take oral diabetes medicines (pills) the morning of surgery.  . THE DAY BEFORE SURGERY, take your usual dose of Metformin        Patient Signature:  Date:   Nurse Signature:  Date:   Reviewed and Endorsed by [facility] Patient Education Committee, August 2015           [facility] - Preparing for Surgery Before surgery, you can play an important role.  Because skin is not sterile, your skin needs to be as free of germs as possible.  You can reduce the number of germs on your skin by washing with CHG (chlorahexidine gluconate) soap before surgery.  CHG is an antiseptic cleaner which kills germs and bonds  with the skin to continue killing germs even after washing. Please DO NOT use if you have an allergy to CHG or antibacterial soaps.  If your skin becomes reddened/irritated stop using the CHG and inform your nurse when you arrive at Short Stay. Do not shave (including legs and underarms) for at least 48 hours prior to the first CHG shower.  You may shave your face/neck. Please follow these instructions carefully:  1.  Shower with CHG Soap the night before surgery and the  morning of Surgery.  2.  If you choose to wash your hair, wash your hair first as usual with your  normal  shampoo.  3.  After you shampoo, rinse your hair and body thoroughly to remove the  shampoo.                           4.  Use CHG as you would any other liquid soap.  You can apply chg directly  to the skin and wash                       Gently with a scrungie or clean washcloth.  5.  Apply the CHG Soap to your body ONLY FROM THE NECK DOWN.   Do not use on face/ open                           Wound or open sores. Avoid contact with eyes, ears mouth and genitals (private parts).                       Wash face,  Genitals (private parts) with your normal soap.             6.  Wash thoroughly, paying special attention to the area where your surgery  will be performed.  7.  Thoroughly rinse your body with warm water from the neck down.  8.  DO NOT shower/wash with your normal soap after using and rinsing off  the CHG Soap.                9.  Pat yourself dry with a clean towel.            10.  Wear clean pajamas.            11.  Place clean sheets on your bed the night of your first shower and do not  sleep with pets. Day of Surgery : Do not apply any lotions/deodorants the morning of surgery.  Please wear clean clothes to the hospital/surgery center.  FAILURE TO FOLLOW THESE INSTRUCTIONS MAY RESULT IN THE CANCELLATION OF YOUR SURGERY PATIENT SIGNATURE_________________________________  NURSE  SIGNATURE__________________________________  ________________________________________________________________________   Paula [name]  An incentive spirometer is a tool that can help keep your lungs clear and active. This tool measures how well you are filling your lungs with each breath. Taking long deep breaths may help reverse or decrease the chance of developing breathing (pulmonary) problems (especially infection) following:  A long period of time when you are unable to move or be active. BEFORE THE PROCEDURE   If the spirometer includes an indicator to show your best effort, your nurse or respiratory therapist will set it to a desired goal.  If possible, sit up straight or lean slightly forward. Try not to slouch.  Hold the incentive spirometer in an upright position. INSTRUCTIONS FOR USE  1. Sit on the edge of your bed if possible, or sit up as far as you can in bed or on a chair. 2. Hold the incentive spirometer in an upright position. 3. Breathe out normally. 4. Place the mouthpiece in your mouth and seal your lips tightly around it. 5. Breathe in slowly and as deeply as possible, raising the piston or the ball toward the top of the column. 6. Hold your breath for 3-5 seconds or for as long as possible. Allow the piston or ball to fall to the bottom of the column. 7. Remove the mouthpiece from your mouth and breathe out normally. 8. Rest for a few seconds and repeat Steps 1 through 7 at least 10 times every 1-2 hours when you are awake. Take your time and take a few normal breaths between deep breaths. 9. The spirometer may include an indicator to show your best effort. Use the indicator as a goal to work toward during each repetition. 10. After each set of 10 deep breaths, practice coughing to be sure your lungs are clear. If you have an incision (the cut made at the time of surgery), support your incision when coughing by placing a pillow or rolled up towels firmly  against it. Once you are able to get out of bed, walk around indoors and cough well. You may stop using the incentive spirometer when instructed by your caregiver.  RISKS AND COMPLICATIONS  Take your time so you do not get dizzy or light-headed.  If you are in pain, you may need to take or ask for pain medication before doing incentive spirometry. It is harder to take a deep breath if you are having pain. AFTER USE  Rest and breathe slowly and easily.  It can be helpful to keep track of a log of your progress. Your caregiver can provide you with a simple table to help with this. If you are using the spirometer at home, follow these instructions: [place] IF:   You are having difficultly using the spirometer.  You have trouble using the spirometer as often as instructed.  Your pain medication is not giving enough relief while using the spirometer.  You develop fever  of 100.5 F (38.1 C) or higher. SEEK IMMEDIATE MEDICAL CARE IF:   You cough up bloody sputum that had not been present before.  You develop fever of 102 F (38.9 C) or greater.  You develop worsening pain at or near the incision site. MAKE SURE YOU:   Understand these instructions.  Will watch your condition.  Will get help right away if you are not doing well or get worse. Document Released: 06/21/2006 Document Revised: 05/03/2011 Document Reviewed: 08/22/2006 ExitCare Patient Information 2014 Exit[employer], [place].   ________________________________________________________________________  WHAT IS A BLOOD TRANSFUSION? Blood Transfusion Information  A transfusion is the replacement of blood or some of its parts. Blood is made up of multiple cells which provide different functions.  Red blood cells carry oxygen and are used for blood loss replacement.  White blood cells fight against infection.  Platelets control bleeding.  Plasma helps clot blood.  Other blood products are available for  specialized needs, such as hemophilia or other clotting disorders. BEFORE THE TRANSFUSION  Who gives blood for transfusions?   Healthy volunteers who are fully evaluated to make sure their blood is safe. This is blood bank blood. Transfusion therapy is the safest it has ever been in the practice of medicine. Before blood is taken from a donor, a complete history is taken to make sure that person has no history of diseases nor engages in risky social behavior (examples are intravenous drug use or sexual activity with multiple partners). The donor's travel history is screened to minimize risk of transmitting infections, such as malaria. The donated blood is tested for signs of infectious diseases, such as HIV and hepatitis. The blood is then tested to be sure it is compatible with you in order to minimize the chance of a transfusion reaction. If you or a relative donates blood, this is often done in anticipation of surgery and is not appropriate for emergency situations. It takes many days to process the donated blood. RISKS AND COMPLICATIONS Although transfusion therapy is very safe and saves many lives, the main dangers of transfusion include:   Getting an infectious disease.  Developing a transfusion reaction. This is an allergic reaction to something in the blood you were given. Every precaution is taken to prevent this. The decision to have a blood transfusion has been considered carefully by your caregiver before blood is given. Blood is not given unless the benefits outweigh the risks. AFTER THE TRANSFUSION  Right after receiving a blood transfusion, you will usually feel much better and more energetic. This is especially true if your red blood cells have gotten low (anemic). The transfusion raises the level of the red blood cells which carry oxygen, and this usually causes an energy increase.  The nurse administering the transfusion will monitor you carefully for complications. HOME CARE  INSTRUCTIONS  No special instructions are needed after a transfusion. You may find your energy is better. Speak with your caregiver about any limitations on activity for underlying diseases you may have. SEEK MEDICAL CARE IF:   Your condition is not improving after your transfusion.  You develop redness or irritation at the intravenous (IV) site. SEEK IMMEDIATE MEDICAL CARE IF:  Any of the following symptoms occur over the next 12 hours:  Shaking chills.  You have a temperature by mouth above 102 F (38.9 C), not controlled by medicine.  Chest, back, or muscle pain.  People around you feel you are not acting correctly or are confused.  Shortness of breath or  difficulty breathing.  Dizziness and fainting.  You get a rash or develop hives.  You have a decrease in urine output.  Your urine turns a dark color or changes to pink, red, or brown. Any of the following symptoms occur over the next 10 days:  You have a temperature by mouth above 102 F (38.9 C), not controlled by medicine.  Shortness of breath.  Weakness after normal activity.  The white part of the eye turns yellow (jaundice).  You have a decrease in the amount of urine or are urinating less often.  Your urine turns a dark color or changes to pink, red, or brown. Document Released: 02/06/2000 Document Revised: 05/03/2011 Document Reviewed: 09/25/2007 [facility] Patient Information 2014 [place], [place].  _______________________________________________________________________

## 2016-11-17 NOTE — Progress Notes (Signed)
10-21-16 Surgical clearance from Dr. Shelia Media, along with CBC w/Diff, CMP on chart  08-12-16 EKG on chart

## 2016-11-18 ENCOUNTER — Encounter (HOSPITAL_COMMUNITY)
Admission: RE | Admit: 2016-11-18 | Discharge: 2016-11-18 | Disposition: A | Discharge status: Home / Self Care | Payer: Medicare Other | Source: Ambulatory Visit | Attending: Orthopedic Surgery | Admitting: Orthopedic Surgery

## 2016-11-18 ENCOUNTER — Encounter (HOSPITAL_COMMUNITY): Payer: Self-pay

## 2016-11-18 DIAGNOSIS — M1611 Unilateral primary osteoarthritis, right hip: Secondary | ICD-10-CM | POA: Diagnosis not present

## 2016-11-18 DIAGNOSIS — Z01812 Encounter for preprocedural laboratory examination: Secondary | ICD-10-CM | POA: Insufficient documentation

## 2016-11-18 LAB — HEMOGLOBIN A1C
Hgb A1c MFr Bld: 5.7 % — ABNORMAL HIGH (ref 4.8–5.6)
MEAN PLASMA GLUCOSE: 116.89 mg/dL

## 2016-11-18 LAB — ABO/RH: ABO/RH(D): O NEG

## 2016-11-18 LAB — SURGICAL PCR SCREEN
MRSA, PCR: NEGATIVE
Staphylococcus aureus: NEGATIVE

## 2016-11-18 LAB — GLUCOSE, CAPILLARY: GLUCOSE-CAPILLARY: 106 mg/dL — AB (ref 65–99)

## 2016-11-24 NOTE — Anesthesia Preprocedure Evaluation (Addendum)
Anesthesia Evaluation  Patient identified by MRN, date of birth, ID band Patient awake    Reviewed: Allergy & Precautions, NPO status , Patient's Chart, lab work & pertinent test results  History of Anesthesia Complications Negative for: history of anesthetic complications  Airway Mallampati: II  TM Distance: >3 FB Neck ROM: Full    Dental no notable dental hx.    Pulmonary sleep apnea , former smoker,    Pulmonary exam normal        Cardiovascular hypertension, negative cardio ROS Normal cardiovascular exam+ Valvular Problems/Murmurs AI      Neuro/Psych negative neurological ROS     GI/Hepatic Neg liver ROS, hiatal hernia, GERD  ,  Endo/Other  diabetes  Renal/GU negative Renal ROS     Musculoskeletal negative musculoskeletal ROS (+)   Abdominal   Peds  Hematology negative hematology ROS (+)   Anesthesia Other Findings Day of surgery medications reviewed with the patient.  Reproductive/Obstetrics                            Anesthesia Physical Anesthesia Plan  ASA: II  Anesthesia Plan: MAC and Spinal   Post-op Pain Management:    Induction: Intravenous  PONV Risk Score and Plan: 2 and Ondansetron and Dexamethasone  Airway Management Planned: Natural Airway  Additional Equipment:   Intra-op Plan:   Post-operative Plan: Extubation in OR  Informed Consent: I have reviewed the patients History and Physical, chart, labs and discussed the procedure including the risks, benefits and alternatives for the proposed anesthesia with the patient or authorized representative who has indicated his/her understanding and acceptance.   Dental advisory given  Plan Discussed with: Anesthesiologist and CRNA  Anesthesia Plan Comments:       Anesthesia Quick Evaluation

## 2016-11-25 ENCOUNTER — Inpatient Hospital Stay (HOSPITAL_COMMUNITY): Payer: Medicare Other

## 2016-11-25 ENCOUNTER — Encounter (HOSPITAL_COMMUNITY): Payer: Self-pay

## 2016-11-25 ENCOUNTER — Inpatient Hospital Stay (HOSPITAL_COMMUNITY): Payer: Medicare Other | Admitting: Anesthesiology

## 2016-11-25 ENCOUNTER — Inpatient Hospital Stay (HOSPITAL_COMMUNITY)
Admission: RE | Admit: 2016-11-25 | Discharge: 2016-11-26 | DRG: 470 | Disposition: A | Discharge status: Home / Self Care | Payer: Medicare Other | Source: Ambulatory Visit | Attending: Orthopedic Surgery | Admitting: Orthopedic Surgery

## 2016-11-25 ENCOUNTER — Encounter (HOSPITAL_COMMUNITY)
Admission: RE | Disposition: A | Discharge status: Home / Self Care | Payer: Self-pay | Source: Ambulatory Visit | Attending: Orthopedic Surgery

## 2016-11-25 DIAGNOSIS — E785 Hyperlipidemia, unspecified: Secondary | ICD-10-CM | POA: Diagnosis not present

## 2016-11-25 DIAGNOSIS — Z09 Encounter for follow-up examination after completed treatment for conditions other than malignant neoplasm: Secondary | ICD-10-CM

## 2016-11-25 DIAGNOSIS — M25551 Pain in right hip: Secondary | ICD-10-CM

## 2016-11-25 DIAGNOSIS — Z471 Aftercare following joint replacement surgery: Secondary | ICD-10-CM | POA: Diagnosis not present

## 2016-11-25 DIAGNOSIS — Z8349 Family history of other endocrine, nutritional and metabolic diseases: Secondary | ICD-10-CM

## 2016-11-25 DIAGNOSIS — E119 Type 2 diabetes mellitus without complications: Secondary | ICD-10-CM | POA: Diagnosis not present

## 2016-11-25 DIAGNOSIS — Z8249 Family history of ischemic heart disease and other diseases of the circulatory system: Secondary | ICD-10-CM | POA: Diagnosis not present

## 2016-11-25 DIAGNOSIS — Z833 Family history of diabetes mellitus: Secondary | ICD-10-CM | POA: Diagnosis not present

## 2016-11-25 DIAGNOSIS — Z823 Family history of stroke: Secondary | ICD-10-CM | POA: Diagnosis not present

## 2016-11-25 DIAGNOSIS — M1611 Unilateral primary osteoarthritis, right hip: Secondary | ICD-10-CM | POA: Diagnosis not present

## 2016-11-25 DIAGNOSIS — Z801 Family history of malignant neoplasm of trachea, bronchus and lung: Secondary | ICD-10-CM | POA: Diagnosis not present

## 2016-11-25 DIAGNOSIS — Z87891 Personal history of nicotine dependence: Secondary | ICD-10-CM | POA: Diagnosis not present

## 2016-11-25 DIAGNOSIS — K219 Gastro-esophageal reflux disease without esophagitis: Secondary | ICD-10-CM | POA: Diagnosis not present

## 2016-11-25 DIAGNOSIS — M1990 Unspecified osteoarthritis, unspecified site: Secondary | ICD-10-CM | POA: Diagnosis not present

## 2016-11-25 DIAGNOSIS — I10 Essential (primary) hypertension: Secondary | ICD-10-CM | POA: Diagnosis present

## 2016-11-25 DIAGNOSIS — Z96641 Presence of right artificial hip joint: Secondary | ICD-10-CM | POA: Diagnosis not present

## 2016-11-25 HISTORY — PX: TOTAL HIP ARTHROPLASTY: SHX124

## 2016-11-25 LAB — TYPE AND SCREEN
ABO/RH(D): O NEG
Antibody Screen: NEGATIVE

## 2016-11-25 LAB — GLUCOSE, CAPILLARY
GLUCOSE-CAPILLARY: 126 mg/dL — AB (ref 65–99)
GLUCOSE-CAPILLARY: 241 mg/dL — AB (ref 65–99)
GLUCOSE-CAPILLARY: 197 mg/dL — AB (ref 65–99)
Glucose-Capillary: 117 mg/dL — ABNORMAL HIGH (ref 65–99)
Glucose-Capillary: 148 mg/dL — ABNORMAL HIGH (ref 65–99)

## 2016-11-25 SURGERY — ARTHROPLASTY, HIP, TOTAL, ANTERIOR APPROACH
Anesthesia: Monitor Anesthesia Care | Site: Hip | Laterality: Right

## 2016-11-25 MED ORDER — BUPIVACAINE HCL (PF) 0.5 % IJ SOLN
INTRAMUSCULAR | Status: DC | PRN
  Administered 2016-11-25: 3 mL

## 2016-11-25 MED ORDER — KETOROLAC TROMETHAMINE 30 MG/ML IJ SOLN
INTRAMUSCULAR | Status: AC
  Filled 2016-11-25: qty 1

## 2016-11-25 MED ORDER — ASPIRIN 81 MG PO CHEW
81.0000 mg | CHEWABLE_TABLET | Freq: Two times a day (BID) | ORAL | Status: DC
  Administered 2016-11-25 – 2016-11-26 (×2): 81 mg via ORAL
  Filled 2016-11-25 (×2): qty 1

## 2016-11-25 MED ORDER — PROPOFOL 10 MG/ML IV BOLUS
INTRAVENOUS | Status: AC
  Filled 2016-11-25: qty 20

## 2016-11-25 MED ORDER — FENTANYL CITRATE (PF) 100 MCG/2ML IJ SOLN
INTRAMUSCULAR | Status: DC | PRN
  Administered 2016-11-25: 100 ug via INTRAVENOUS

## 2016-11-25 MED ORDER — SODIUM CHLORIDE 0.9 % IJ SOLN
INTRAMUSCULAR | Status: DC | PRN
  Administered 2016-11-25: 30 mL

## 2016-11-25 MED ORDER — DIPHENHYDRAMINE HCL 12.5 MG/5ML PO ELIX: 12.5000 mg | ORAL_SOLUTION | ORAL | Status: DC | PRN

## 2016-11-25 MED ORDER — MENTHOL 3 MG MT LOZG: 1.0000 | LOZENGE | OROMUCOSAL | Status: DC | PRN

## 2016-11-25 MED ORDER — POVIDONE-IODINE 10 % EX SWAB
2.0000 "application " | Freq: Once | CUTANEOUS | Status: AC
  Administered 2016-11-25: 2 via TOPICAL

## 2016-11-25 MED ORDER — METOCLOPRAMIDE HCL 5 MG/ML IJ SOLN
5.0000 mg | Freq: Three times a day (TID) | INTRAMUSCULAR | Status: DC | PRN
  Administered 2016-11-25: 21:00:00 10 mg via INTRAVENOUS
  Filled 2016-11-25: qty 2

## 2016-11-25 MED ORDER — LACTATED RINGERS IV SOLN
INTRAVENOUS | Status: DC | PRN
  Administered 2016-11-25 (×2): via INTRAVENOUS

## 2016-11-25 MED ORDER — METOCLOPRAMIDE HCL 5 MG PO TABS: 5.0000 mg | ORAL_TABLET | Freq: Three times a day (TID) | ORAL | Status: DC | PRN

## 2016-11-25 MED ORDER — CEFAZOLIN SODIUM-DEXTROSE 2-4 GM/100ML-% IV SOLN
2.0000 g | INTRAVENOUS | Status: AC
  Administered 2016-11-25: 2 g via INTRAVENOUS
  Filled 2016-11-25: qty 100

## 2016-11-25 MED ORDER — KETOROLAC TROMETHAMINE 15 MG/ML IJ SOLN
7.5000 mg | Freq: Four times a day (QID) | INTRAMUSCULAR | Status: DC
  Administered 2016-11-25 – 2016-11-26 (×3): 7.5 mg via INTRAVENOUS
  Filled 2016-11-25 (×3): qty 1

## 2016-11-25 MED ORDER — ACETAMINOPHEN 10 MG/ML IV SOLN
1000.0000 mg | INTRAVENOUS | Status: AC
  Administered 2016-11-25: 1000 mg via INTRAVENOUS
  Filled 2016-11-25: qty 100

## 2016-11-25 MED ORDER — EPHEDRINE 5 MG/ML INJ
INTRAVENOUS | Status: AC
  Filled 2016-11-25: qty 20

## 2016-11-25 MED ORDER — HYDROMORPHONE HCL-NACL 0.5-0.9 MG/ML-% IV SOSY: 0.5000 mg | PREFILLED_SYRINGE | INTRAVENOUS | Status: DC | PRN

## 2016-11-25 MED ORDER — PROPOFOL 500 MG/50ML IV EMUL
INTRAVENOUS | Status: DC | PRN
  Administered 2016-11-25: 30 mg via INTRAVENOUS

## 2016-11-25 MED ORDER — DEXAMETHASONE SODIUM PHOSPHATE 10 MG/ML IJ SOLN
INTRAMUSCULAR | Status: DC | PRN
  Administered 2016-11-25: 10 mg via INTRAVENOUS

## 2016-11-25 MED ORDER — HYDROCODONE-ACETAMINOPHEN 5-325 MG PO TABS
1.0000 | ORAL_TABLET | ORAL | Status: DC | PRN
  Administered 2016-11-25 – 2016-11-26 (×6): 2 via ORAL
  Filled 2016-11-25 (×6): qty 2

## 2016-11-25 MED ORDER — ISOPROPYL ALCOHOL 70 % SOLN
Status: DC | PRN
  Administered 2016-11-25: 1 via TOPICAL

## 2016-11-25 MED ORDER — MIDAZOLAM HCL 5 MG/5ML IJ SOLN
INTRAMUSCULAR | Status: DC | PRN
Start: 2016-11-25 — End: 2016-11-25
  Administered 2016-11-25: 2 mg via INTRAVENOUS

## 2016-11-25 MED ORDER — 0.9 % SODIUM CHLORIDE (POUR BTL) OPTIME
TOPICAL | Status: DC | PRN
  Administered 2016-11-25: 1000 mL

## 2016-11-25 MED ORDER — WATER FOR IRRIGATION, STERILE IR SOLN
Status: DC | PRN
  Administered 2016-11-25: 2000 mL

## 2016-11-25 MED ORDER — FAMOTIDINE 20 MG PO TABS
40.0000 mg | ORAL_TABLET | Freq: Every day | ORAL | Status: DC
  Administered 2016-11-25: 40 mg via ORAL
  Filled 2016-11-25: qty 2

## 2016-11-25 MED ORDER — LISINOPRIL 10 MG PO TABS
10.0000 mg | ORAL_TABLET | Freq: Every day | ORAL | Status: DC
  Administered 2016-11-26: 08:00:00 10 mg via ORAL
  Filled 2016-11-25: qty 1

## 2016-11-25 MED ORDER — CHLORHEXIDINE GLUCONATE 4 % EX LIQD: 60.0000 mL | Freq: Once | CUTANEOUS | Status: DC

## 2016-11-25 MED ORDER — CEFAZOLIN SODIUM-DEXTROSE 2-4 GM/100ML-% IV SOLN
2.0000 g | Freq: Four times a day (QID) | INTRAVENOUS | Status: AC
  Administered 2016-11-25 (×2): 2 g via INTRAVENOUS
  Filled 2016-11-25 (×2): qty 100

## 2016-11-25 MED ORDER — LEVOTHYROXINE SODIUM 50 MCG PO TABS
50.0000 ug | ORAL_TABLET | Freq: Every day | ORAL | Status: DC
  Administered 2016-11-26: 08:00:00 50 ug via ORAL
  Filled 2016-11-25 (×2): qty 1

## 2016-11-25 MED ORDER — METHOCARBAMOL 1000 MG/10ML IJ SOLN
500.0000 mg | Freq: Four times a day (QID) | INTRAVENOUS | Status: DC | PRN
  Administered 2016-11-25: 500 mg via INTRAVENOUS
  Filled 2016-11-25: qty 550

## 2016-11-25 MED ORDER — SODIUM CHLORIDE 0.9 % IJ SOLN
INTRAMUSCULAR | Status: AC
  Filled 2016-11-25: qty 30

## 2016-11-25 MED ORDER — HYDROMORPHONE HCL-NACL 0.5-0.9 MG/ML-% IV SOSY
PREFILLED_SYRINGE | INTRAVENOUS | Status: AC
  Administered 2016-11-25: 0.5 mg via INTRAVENOUS
  Filled 2016-11-25: qty 1

## 2016-11-25 MED ORDER — HYDROMORPHONE HCL-NACL 0.5-0.9 MG/ML-% IV SOSY
0.2500 mg | PREFILLED_SYRINGE | INTRAVENOUS | Status: DC | PRN
  Administered 2016-11-25: 0.5 mg via INTRAVENOUS

## 2016-11-25 MED ORDER — ONDANSETRON HCL 4 MG/2ML IJ SOLN
INTRAMUSCULAR | Status: DC | PRN
  Administered 2016-11-25: 4 mg via INTRAVENOUS

## 2016-11-25 MED ORDER — METHOCARBAMOL 500 MG PO TABS
500.0000 mg | ORAL_TABLET | Freq: Four times a day (QID) | ORAL | Status: DC | PRN
Start: 2016-11-25 — End: 2016-11-26

## 2016-11-25 MED ORDER — ROSUVASTATIN CALCIUM 5 MG PO TABS
5.0000 mg | ORAL_TABLET | Freq: Every day | ORAL | Status: DC
Start: 2016-11-25 — End: 2016-11-26
  Administered 2016-11-25: 17:00:00 5 mg via ORAL
  Filled 2016-11-25: qty 1

## 2016-11-25 MED ORDER — ISOPROPYL ALCOHOL 70 % SOLN
Status: AC
  Filled 2016-11-25: qty 480

## 2016-11-25 MED ORDER — ONDANSETRON HCL 4 MG/2ML IJ SOLN: 4.0000 mg | Freq: Four times a day (QID) | INTRAMUSCULAR | Status: DC | PRN

## 2016-11-25 MED ORDER — KETOROLAC TROMETHAMINE 30 MG/ML IJ SOLN
INTRAMUSCULAR | Status: DC | PRN
  Administered 2016-11-25: 30 mg

## 2016-11-25 MED ORDER — ACETAMINOPHEN 650 MG RE SUPP: 650.0000 mg | Freq: Four times a day (QID) | RECTAL | Status: DC | PRN

## 2016-11-25 MED ORDER — METFORMIN HCL 500 MG PO TABS
500.0000 mg | ORAL_TABLET | Freq: Two times a day (BID) | ORAL | Status: DC
  Administered 2016-11-25 – 2016-11-26 (×2): 500 mg via ORAL
  Filled 2016-11-25 (×2): qty 1

## 2016-11-25 MED ORDER — EPHEDRINE SULFATE 50 MG/ML IJ SOLN
INTRAMUSCULAR | Status: DC | PRN
  Administered 2016-11-25: 5 mg via INTRAVENOUS
  Administered 2016-11-25 (×3): 10 mg via INTRAVENOUS
  Administered 2016-11-25: 5 mg via INTRAVENOUS

## 2016-11-25 MED ORDER — PROPOFOL 500 MG/50ML IV EMUL
INTRAVENOUS | Status: DC | PRN
  Administered 2016-11-25: 50 ug/kg/min via INTRAVENOUS

## 2016-11-25 MED ORDER — DOCUSATE SODIUM 100 MG PO CAPS
100.0000 mg | ORAL_CAPSULE | Freq: Two times a day (BID) | ORAL | Status: DC
  Administered 2016-11-25 – 2016-11-26 (×2): 100 mg via ORAL
  Filled 2016-11-25 (×2): qty 1

## 2016-11-25 MED ORDER — SODIUM CHLORIDE 0.9 % IV SOLN: INTRAVENOUS | Status: DC

## 2016-11-25 MED ORDER — BUPIVACAINE-EPINEPHRINE (PF) 0.25% -1:200000 IJ SOLN
INTRAMUSCULAR | Status: AC
  Filled 2016-11-25: qty 30

## 2016-11-25 MED ORDER — POLYVINYL ALCOHOL 1.4 % OP SOLN
1.0000 [drp] | Freq: Three times a day (TID) | OPHTHALMIC | Status: DC | PRN
  Filled 2016-11-25: qty 15

## 2016-11-25 MED ORDER — PROPOFOL 10 MG/ML IV BOLUS
INTRAVENOUS | Status: AC
  Filled 2016-11-25: qty 40

## 2016-11-25 MED ORDER — BUPIVACAINE-EPINEPHRINE 0.25% -1:200000 IJ SOLN
INTRAMUSCULAR | Status: DC | PRN
  Administered 2016-11-25: 30 mL

## 2016-11-25 MED ORDER — TRANEXAMIC ACID 1000 MG/10ML IV SOLN
1000.0000 mg | INTRAVENOUS | Status: AC
  Administered 2016-11-25: 1000 mg via INTRAVENOUS
  Filled 2016-11-25: qty 1100
  Filled 2016-11-25: qty 10

## 2016-11-25 MED ORDER — PHENOL 1.4 % MT LIQD
1.0000 | OROMUCOSAL | Status: DC | PRN
  Filled 2016-11-25: qty 177

## 2016-11-25 MED ORDER — MIDAZOLAM HCL 2 MG/2ML IJ SOLN
INTRAMUSCULAR | Status: AC
  Filled 2016-11-25: qty 2

## 2016-11-25 MED ORDER — DEXAMETHASONE SODIUM PHOSPHATE 10 MG/ML IJ SOLN
10.0000 mg | Freq: Once | INTRAMUSCULAR | Status: DC
  Filled 2016-11-25: qty 1

## 2016-11-25 MED ORDER — SODIUM CHLORIDE 0.9 % IV SOLN
INTRAVENOUS | Status: DC
  Administered 2016-11-25 (×2): via INTRAVENOUS

## 2016-11-25 MED ORDER — ACETAMINOPHEN 325 MG PO TABS: 650.0000 mg | ORAL_TABLET | Freq: Four times a day (QID) | ORAL | Status: DC | PRN

## 2016-11-25 MED ORDER — EPHEDRINE 5 MG/ML INJ
INTRAVENOUS | Status: AC
  Filled 2016-11-25: qty 10

## 2016-11-25 MED ORDER — SERTRALINE HCL 50 MG PO TABS
50.0000 mg | ORAL_TABLET | Freq: Every day | ORAL | Status: DC
  Administered 2016-11-26: 50 mg via ORAL
  Filled 2016-11-25 (×2): qty 1

## 2016-11-25 MED ORDER — PROMETHAZINE HCL 25 MG/ML IJ SOLN: 6.2500 mg | INTRAMUSCULAR | Status: DC | PRN

## 2016-11-25 MED ORDER — SENNA 8.6 MG PO TABS
2.0000 | ORAL_TABLET | Freq: Every day | ORAL | Status: DC
  Administered 2016-11-25: 23:00:00 17.2 mg via ORAL

## 2016-11-25 MED ORDER — FENTANYL CITRATE (PF) 100 MCG/2ML IJ SOLN
INTRAMUSCULAR | Status: AC
  Filled 2016-11-25: qty 2

## 2016-11-25 MED ORDER — ONDANSETRON HCL 4 MG PO TABS: 4.0000 mg | ORAL_TABLET | Freq: Four times a day (QID) | ORAL | Status: DC | PRN

## 2016-11-25 MED ORDER — INSULIN ASPART 100 UNIT/ML ~~LOC~~ SOLN
0.0000 [IU] | Freq: Three times a day (TID) | SUBCUTANEOUS | Status: DC
  Administered 2016-11-25: 5 [IU] via SUBCUTANEOUS
  Administered 2016-11-25 – 2016-11-26 (×2): 2 [IU] via SUBCUTANEOUS

## 2016-11-25 SURGICAL SUPPLY — 46 items
ADH SKN CLS APL DERMABOND .7 (GAUZE/BANDAGES/DRESSINGS) ×2
BAG DECANTER FOR FLEXI CONT (MISCELLANEOUS) IMPLANT
BAG SPEC THK2 15X12 ZIP CLS (MISCELLANEOUS)
BAG ZIPLOCK 12X15 (MISCELLANEOUS) IMPLANT
CAPT HIP TOTAL 2 ×2 IMPLANT
CHLORAPREP W/TINT 26ML (MISCELLANEOUS) ×3 IMPLANT
CLOTH BEACON ORANGE TIMEOUT ST (SAFETY) ×3 IMPLANT
COVER PERINEAL POST (MISCELLANEOUS) ×3 IMPLANT
COVER SURGICAL LIGHT HANDLE (MISCELLANEOUS) ×3 IMPLANT
DECANTER SPIKE VIAL GLASS SM (MISCELLANEOUS) ×3 IMPLANT
DERMABOND ADVANCED (GAUZE/BANDAGES/DRESSINGS) ×4
DERMABOND ADVANCED .7 DNX12 (GAUZE/BANDAGES/DRESSINGS) ×2 IMPLANT
DRAPE SHEET LG 3/4 BI-LAMINATE (DRAPES) ×9 IMPLANT
DRAPE STERI IOBAN 125X83 (DRAPES) ×3 IMPLANT
DRAPE U-SHAPE 47X51 STRL (DRAPES) ×6 IMPLANT
DRSG AQUACEL AG ADV 3.5X10 (GAUZE/BANDAGES/DRESSINGS) ×3 IMPLANT
ELECT PENCIL ROCKER SW 15FT (MISCELLANEOUS) ×3 IMPLANT
ELECT REM PT RETURN 15FT ADLT (MISCELLANEOUS) ×3 IMPLANT
GAUZE SPONGE 4X4 12PLY STRL (GAUZE/BANDAGES/DRESSINGS) ×3 IMPLANT
GLOVE BIO SURGEON STRL SZ8.5 (GLOVE) ×6 IMPLANT
GLOVE BIOGEL PI IND STRL 8.5 (GLOVE) ×1 IMPLANT
GLOVE BIOGEL PI INDICATOR 8.5 (GLOVE) ×2
GOWN SPEC L3 XXLG W/TWL (GOWN DISPOSABLE) ×3 IMPLANT
HANDPIECE INTERPULSE COAX TIP (DISPOSABLE) ×3
HOLDER FOLEY CATH W/STRAP (MISCELLANEOUS) ×3 IMPLANT
HOOD PEEL AWAY FLYTE STAYCOOL (MISCELLANEOUS) ×6 IMPLANT
MARKER SKIN DUAL TIP RULER LAB (MISCELLANEOUS) ×3 IMPLANT
NDL SPNL 18GX3.5 QUINCKE PK (NEEDLE) ×1 IMPLANT
NEEDLE SPNL 18GX3.5 QUINCKE PK (NEEDLE) ×3 IMPLANT
PACK ANTERIOR HIP CUSTOM (KITS) ×3 IMPLANT
SAW OSC TIP CART 19.5X105X1.3 (SAW) ×3 IMPLANT
SEALER BIPOLAR AQUA 6.0 (INSTRUMENTS) ×3 IMPLANT
SET HNDPC FAN SPRY TIP SCT (DISPOSABLE) ×1 IMPLANT
SUT ETHIBOND NAB CT1 #1 30IN (SUTURE) ×6 IMPLANT
SUT MNCRL AB 3-0 PS2 18 (SUTURE) ×3 IMPLANT
SUT MON AB 2-0 CT1 36 (SUTURE) ×6 IMPLANT
SUT STRATAFIX PDO 1 14 VIOLET (SUTURE) ×3
SUT STRATFX PDO 1 14 VIOLET (SUTURE) ×1
SUT VIC AB 2-0 CT1 27 (SUTURE) ×3
SUT VIC AB 2-0 CT1 TAPERPNT 27 (SUTURE) ×1 IMPLANT
SUTURE STRATFX PDO 1 14 VIOLET (SUTURE) ×1 IMPLANT
SYR 50ML LL SCALE MARK (SYRINGE) ×3 IMPLANT
TRAY FOLEY CATH 14FRSI W/METER (CATHETERS) ×2 IMPLANT
TRAY FOLEY W/METER SILVER 16FR (SET/KITS/TRAYS/PACK) IMPLANT
WATER STERILE IRR 1500ML POUR (IV SOLUTION) ×5 IMPLANT
YANKAUER SUCT BULB TIP 10FT TU (MISCELLANEOUS) ×3 IMPLANT

## 2016-11-25 NOTE — Interval H&P Note (Signed)
History and Physical Interval Note:  11/25/2016 7:31 AM  Paula [name]  has presented today for surgery, with the diagnosis of Degenerative joint disease right hip  The various methods of treatment have been discussed with the patient and family. After consideration of risks, benefits and other options for treatment, the patient has consented to  Procedure(s) with comments: RIGHT TOTAL HIP ARTHROPLASTY ANTERIOR APPROACH (Right) - NEEDS RNFA as a surgical intervention .  The patient's history has been reviewed, patient examined, no change in status, stable for surgery.  I have reviewed the patient's chart and labs.  Questions were answered to the patient's satisfaction.     Kaelum Kissick, Horald Pollen

## 2016-11-25 NOTE — Transfer of Care (Signed)
Immediate Anesthesia Transfer of Care Note  Patient: Paula [name]  Procedure(s) Performed: RIGHT TOTAL HIP ARTHROPLASTY ANTERIOR APPROACH (Right Hip)  Patient Location: PACU  Anesthesia Type:Spinal  Level of Consciousness: awake, alert , oriented and patient cooperative  Airway & Oxygen Therapy: Patient Spontanous Breathing and Patient connected to face mask oxygen  Post-op Assessment: Report given to RN and Post -op Vital signs reviewed and stable  Post vital signs: Reviewed and stable  Last Vitals:  Vitals:   11/25/16 0525  BP: 135/65  Pulse: 73  Resp: 18  Temp: 36.7 C  SpO2: 99%    Last Pain:  Vitals:   11/25/16 0525  TempSrc: Oral      Patients Stated Pain Goal: 4 (09/81/19 1478)  Complications: No apparent anesthesia complications

## 2016-11-25 NOTE — Anesthesia Postprocedure Evaluation (Signed)
Anesthesia Post Note  Patient: Paula [name]  Procedure(s) Performed: RIGHT TOTAL HIP ARTHROPLASTY ANTERIOR APPROACH (Right Hip)     Patient location during evaluation: PACU Anesthesia Type: MAC and Spinal Level of consciousness: awake and alert Pain management: pain level controlled Vital Signs Assessment: post-procedure vital signs reviewed and stable Respiratory status: spontaneous breathing and respiratory function stable Cardiovascular status: blood pressure returned to baseline and stable Postop Assessment: spinal receding Anesthetic complications: no    Last Vitals:  Vitals:   11/25/16 1100 11/25/16 1120  BP: (!) 109/53 (!) 107/47  Pulse: 69 75  Resp: 14 16  Temp: 36.5 C 36.5 C  SpO2: 99% 98%    Last Pain:  Vitals:   11/25/16 1100  TempSrc:   PainSc: Asleep                 Addilynne Olheiser DANIEL

## 2016-11-25 NOTE — H&P (View-Only) (Signed)
TOTAL HIP ADMISSION H&P  Patient is admitted for right total hip arthroplasty.  Subjective:  Chief Complaint: right hip pain  HPI: Paula [name], 66 y.o. female, has a history of pain and functional disability in the right hip(s) due to arthritis and patient has failed non-surgical conservative treatments for greater than 12 weeks to include NSAID's and/or analgesics, flexibility and strengthening excercises, use of assistive devices, weight reduction as appropriate and activity modification.  Onset of symptoms was gradual starting 2 years ago with gradually worsening course since that time.The patient noted no past surgery on the right hip(s).  Patient currently rates pain in the right hip at 10 out of 10 with activity. Patient has night pain, worsening of pain with activity and weight bearing, trendelenberg gait, pain that interfers with activities of daily living, pain with passive range of motion and crepitus. Patient has evidence of subchondral cysts, subchondral sclerosis, periarticular osteophytes and joint space narrowing by imaging studies. This condition presents safety issues increasing the risk of falls. There is no current active infection.  Patient Active Problem List   Diagnosis Date Noted  . Essential hypertension 08/01/2013  . Hyperlipidemia 08/01/2013  . Diabetes ([place]) 08/01/2013  . Sleep apnea 08/01/2013  . Morbid obesity ([place]) 08/01/2013  . Palpitations 08/01/2013  . Paresthesias 08/01/2013   Past Medical History:  Diagnosis Date  . Aortic valve insufficiency   . Diabetes ([place])    type II  . Family history of heart disease    mother, son, and grandparent  . GERD (gastroesophageal reflux disease)   . Hiatal hernia   . History of small bowel obstruction   . HTN (hypertension)   . Hyperlipidemia   . Osteoarthritis   . Palpitations   . Paresthesias    left-sided  . Thyroid nodule   . Varicose vein     Past Surgical History:  Procedure Laterality Date  .  ABDOMINAL HYSTERECTOMY  2003  . COLON SURGERY  2005   colon blockage due to adhesions  . GALLBLADDER SURGERY  1974  . HERNIA REPAIR  2003   incisional  . TUBAL LIGATION  1990  . UMBILICAL HERNIA REPAIR  1997     (Not in a hospital admission) Allergies  Allergen Reactions  . Tetracyclines & Related     Upset stomach    Social History  Substance Use Topics  . Smoking status: Former Smoker    Packs/day: 3.00    Years: 26.00    Quit date: 02/22/1986  . Smokeless tobacco: Not on file  . Alcohol use No     Comment: "seldom"    Family History  Problem Relation Age of Onset  . Cancer Mother        lung  . Heart attack Mother        age 66  . Stroke Father        "mini strokes"  . Cancer Father        prostate  . Diabetes Maternal Grandmother   . Stroke Maternal Grandmother   . Hypertension Maternal Grandmother   . Heart disease Paternal Grandfather   . Hypertension Brother   . Hyperlipidemia Brother   . Heart attack Son        at age 27     Review of Systems  Constitutional: Negative.   HENT: Negative.   Eyes: Negative.   Respiratory: Negative.   Cardiovascular: Negative.   Gastrointestinal: Negative.   Genitourinary: Negative.   Musculoskeletal: Positive for joint pain.  Skin: Positive for itching and rash.  Neurological: Negative.   Endo/Heme/Allergies: Negative.   Psychiatric/Behavioral: Negative.     Objective:  Physical Exam  Vitals reviewed. Constitutional: She is oriented to person, place, and time. She appears well-developed and well-nourished.  HENT:  Head: Normocephalic and atraumatic.  Eyes: Pupils are equal, round, and reactive to light. Conjunctivae and EOM are normal.  Neck: Normal range of motion. Neck supple.  Cardiovascular: Normal rate, regular rhythm and intact distal pulses.   Respiratory: Effort normal and breath sounds normal.  GI: Soft. She exhibits no distension.  Genitourinary:  Genitourinary Comments: deferred   Musculoskeletal:       Right hip: She exhibits decreased range of motion and bony tenderness.  Neurological: She is alert and oriented to person, place, and time. She has normal reflexes.  Skin: Skin is warm and dry.  Psychiatric: She has a normal mood and affect. Her behavior is normal. Judgment and thought content normal.    Vital signs in last 24 hours: @VSRANGES @  Labs:   Estimated body mass index is 32.57 kg/m as calculated from the following:   Height as of 01/08/15: 5\' 1"  (1.549 m).   Weight as of 01/08/15: 78.2 kg (172 lb 6.4 oz).   Imaging Review Plain radiographs demonstrate severe degenerative joint disease of the right hip(s). The bone quality appears to be adequate for age and reported activity level.  Assessment/Plan:  End stage arthritis, right hip(s)  The patient history, physical examination, clinical judgement of the provider and imaging studies are consistent with end stage degenerative joint disease of the right hip(s) and total hip arthroplasty is deemed medically necessary. The treatment options including medical management, injection therapy, arthroscopy and arthroplasty were discussed at length. The risks and benefits of total hip arthroplasty were presented and reviewed. The risks due to aseptic loosening, infection, stiffness, dislocation/subluxation,  thromboembolic complications and other imponderables were discussed.  The patient acknowledged the explanation, agreed to proceed with the plan and consent was signed. Patient is being admitted for inpatient treatment for surgery, pain control, PT, OT, prophylactic antibiotics, VTE prophylaxis, progressive ambulation and ADL's and discharge planning.The patient is planning to be discharged home with home health services vs [facility]

## 2016-11-25 NOTE — Anesthesia Procedure Notes (Signed)
Spinal  Start time: 11/25/2016 7:38 AM End time: 11/25/2016 7:44 AM Staffing Resident/CRNA: Gean Maidens Performed: resident/CRNA  Preanesthetic Checklist Completed: patient identified, site marked, surgical consent, pre-op evaluation, timeout performed, IV checked, risks and benefits discussed and monitors and equipment checked Spinal Block Patient position: sitting Prep: Betadine Patient monitoring: heart rate, continuous pulse ox and blood pressure Approach: midline Location: L3-4 Injection technique: single-shot Needle Needle type: Quincke  Needle gauge: 22 G Needle length: 9 cm Needle insertion depth: 8 cm Additional Notes Pt sitting position, sterile prep and drape, negative paresthesia, negative heme.

## 2016-11-25 NOTE — Discharge Instructions (Signed)
°Dr. Dickie Labarre °Joint Replacement Specialist °[facility] °[address] °[place], [place] [address] °[phone] ° ° °TOTAL HIP REPLACEMENT POSTOPERATIVE DIRECTIONS ° ° ° °Hip Rehabilitation, Guidelines Following Surgery  ° °WEIGHT BEARING °Weight bearing as tolerated with assist device (walker, cane, etc) as directed, use it as long as suggested by your surgeon or therapist, typically at least 4-6 weeks. ° °The results of a hip operation are greatly improved after range of motion and muscle strengthening exercises. Follow all safety measures which are given to protect your hip. If any of these exercises cause increased pain or swelling in your joint, decrease the amount until you are comfortable again. Then slowly increase the exercises. Call your caregiver if you have problems or questions.  ° °HOME CARE INSTRUCTIONS  °Most of the following instructions are designed to prevent the dislocation of your new hip.  °Remove items at home which could result in a fall. This includes throw rugs or furniture in walking pathways.  °Continue medications as instructed at time of discharge. °· You may have some home medications which will be placed on hold until you complete the course of blood thinner medication. °· You may start showering once you are discharged home. Do not remove your dressing. °Do not put on socks or shoes without following the instructions of your caregivers.   °Sit on chairs with arms. Use the chair arms to help push yourself up when arising.  °Arrange for the use of a toilet seat elevator so you are not sitting low.  °· Walk with walker as instructed.  °You may resume a sexual relationship in one month or when given the OK by your caregiver.  °Use walker as long as suggested by your caregivers.  °You may put full weight on your legs and walk as much as is comfortable. °Avoid periods of inactivity such as sitting longer than an hour when not asleep. This helps prevent  blood clots.  °You may return to work once you are cleared by your surgeon.  °Do not drive a car for 6 weeks or until released by your surgeon.  °Do not drive while taking narcotics.  °Wear elastic stockings for two weeks following surgery during the day but you may remove then at night.  °Make sure you keep all of your appointments after your operation with all of your doctors and caregivers. You should call the office at the above phone number and make an appointment for approximately two weeks after the date of your surgery. °Please pick up a stool softener and laxative for home use as long as you are requiring pain medications. °· ICE to the affected hip every three hours for 30 minutes at a time and then as needed for pain and swelling. Continue to use ice on the hip for pain and swelling from surgery. You may notice swelling that will progress down to the foot and ankle.  This is normal after surgery.  Elevate the leg when you are not up walking on it.   °It is important for you to complete the blood thinner medication as prescribed by your doctor. °· Continue to use the breathing machine which will help keep your temperature down.  It is common for your temperature to cycle up and down following surgery, especially at night when you are not up moving around and exerting yourself.  The breathing machine keeps your lungs expanded and your temperature down. ° °RANGE OF MOTION AND STRENGTHENING EXERCISES  °These exercises are   designed to help you keep full movement of your hip joint. Follow your caregiver's or physical therapist's instructions. Perform all exercises about fifteen times, three times per day or as directed. Exercise both hips, even if you have had only one joint replacement. These exercises can be done on a training (exercise) mat, on the floor, on a table or on a bed. Use whatever works the best and is most comfortable for you. Use music or television while you are exercising so that the exercises  are a pleasant break in your day. This will make your life better with the exercises acting as a break in routine you can look forward to.  °Lying on your back, slowly slide your foot toward your buttocks, raising your knee up off the floor. Then slowly slide your foot back down until your leg is straight again.  °Lying on your back spread your legs as far apart as you can without causing discomfort.  °Lying on your side, raise your upper leg and foot straight up from the floor as far as is comfortable. Slowly lower the leg and repeat.  °Lying on your back, tighten up the muscle in the front of your thigh (quadriceps muscles). You can do this by keeping your leg straight and trying to raise your heel off the floor. This helps strengthen the largest muscle supporting your knee.  °Lying on your back, tighten up the muscles of your buttocks both with the legs straight and with the knee bent at a comfortable angle while keeping your heel on the floor.  ° °SKILLED REHAB INSTRUCTIONS: °If the patient is transferred to a skilled rehab facility following release from the hospital, a list of the current medications will be sent to the facility for the patient to continue.  When discharged from the skilled rehab facility, please have the facility set up the patient's Home Health Physical Therapy prior to being released. Also, the skilled facility will be responsible for providing the patient with their medications at time of release from the facility to include their pain medication and their blood thinner medication. If the patient is still at the rehab facility at time of the two week follow up appointment, the skilled rehab facility will also need to assist the patient in arranging follow up appointment in our office and any transportation needs. ° °MAKE SURE YOU:  °Understand these instructions.  °Will watch your condition.  °Will get help right away if you are not doing well or get worse. ° °Pick up stool softner and  laxative for home use following surgery while on pain medications. °Do not remove your dressing. °The dressing is waterproof--it is OK to take showers. °Continue to use ice for pain and swelling after surgery. °Do not use any lotions or creams on the incision until instructed by your surgeon. °Total Hip Protocol. ° ° °

## 2016-11-25 NOTE — Evaluation (Signed)
Physical Therapy Evaluation Patient Details Name: Paula [name] MRN: [id number] DOB: [birth year] Today's Date: 11/25/2016   History of Present Illness  Pt is a 66 year old female s/p R direct anterior THA  Clinical Impression  Pt is s/p THA resulting in the deficits listed below (see PT Problem List).  Pt will benefit from skilled PT to increase their independence and safety with mobility to allow discharge to the venue listed below.  Pt assisted with ambulating in hallway short distance POD #0.  Pt plans to d/c home with spouse.   Follow Up Recommendations DC plan and follow up therapy as arranged by surgeon;Home health PT    Equipment Recommendations  None recommended by PT    Recommendations for Other Services       Precautions / Restrictions Precautions Precautions: Fall Restrictions Other Position/Activity Restrictions: WBAT      Mobility  Bed Mobility Overal bed mobility: Needs Assistance Bed Mobility: Supine to Sit     Supine to sit: Min guard;HOB elevated     General bed mobility comments: verbal cues for technique, increased time  Transfers Overall transfer level: Needs assistance Equipment used: Rolling walker (2 wheeled) Transfers: Sit to/from Stand Sit to Stand: Min assist         General transfer comment: verbal cues for UE and LE positioning, assist to rise and steady  Ambulation/Gait Ambulation/Gait assistance: Min guard Ambulation Distance (Feet): 25 Feet Assistive device: Rolling walker (2 wheeled) Gait Pattern/deviations: Step-to pattern;Decreased stance time - right;Antalgic     General Gait Details: verbal cues for sequence, RW positioning, heel strike  Stairs            Wheelchair Mobility    Modified Rankin (Stroke Patients Only)       Balance                                             Pertinent Vitals/Pain Pain Assessment: 0-10 Pain Score: 3  Pain Location: R hip Pain Descriptors / Indicators:  Sore Pain Intervention(s): Premedicated before session;Limited activity within patient's tolerance;Repositioned;Ice applied    Home Living Family/patient expects to be discharged to:: Private residence Living Arrangements: Spouse/significant other   Type of Home: House Home Access: Stairs to enter Entrance Stairs-Rails: Right Entrance Stairs-Number of Steps: 2 Home Layout: One level Home Equipment: Environmental consultant - 2 wheels      Prior Function Level of Independence: Independent               Hand Dominance        Extremity/Trunk Assessment        Lower Extremity Assessment Lower Extremity Assessment: RLE deficits/detail RLE Deficits / Details: anticipated post op hip weakness, able to perform ankle pumps       Communication   Communication: No difficulties  Cognition Arousal/Alertness: Awake/alert Behavior During Therapy: WFL for tasks assessed/performed Overall Cognitive Status: Within Functional Limits for tasks assessed                                        General Comments      Exercises     Assessment/Plan    PT Assessment Patient needs continued PT services  PT Problem List Decreased strength;Decreased mobility;Decreased activity tolerance;Decreased knowledge of use of DME;Pain  PT Treatment Interventions Functional mobility training;Therapeutic exercise;Stair training;Gait training;Patient/family education;DME instruction    PT Goals (Current goals can be found in the Care Plan section)  Acute Rehab PT Goals PT Goal Formulation: With patient Time For Goal Achievement: 11/30/16 Potential to Achieve Goals: Good    Frequency 7X/week   Barriers to discharge        Co-evaluation               AM-PAC PT "6 Clicks" Daily Activity  Outcome Measure Difficulty turning over in bed (including adjusting bedclothes, sheets and blankets)?: A Little Difficulty moving from lying on back to sitting on the side of the bed? : A  Lot Difficulty sitting down on and standing up from a chair with arms (e.g., wheelchair, bedside commode, etc,.)?: Unable Help needed moving to and from a bed to chair (including a wheelchair)?: A Little Help needed walking in hospital room?: A Little Help needed climbing 3-5 steps with a railing? : A Lot 6 Click Score: 14    End of Session Equipment Utilized During Treatment: Gait belt Activity Tolerance: Patient tolerated treatment well Patient left: with call bell/phone within reach;in chair;with chair alarm set;with family/visitor present Nurse Communication: Mobility status PT Visit Diagnosis: Other abnormalities of gait and mobility (R26.89)    Time: 1444-1500 PT Time Calculation (min) (ACUTE ONLY): 16 min   Charges:   PT Evaluation $PT Eval Low Complexity: 1 Low     PT G CodesCarmelia Bake, PT, DPT 11/25/2016 Pager: [phone]  York Ram E 11/25/2016, 3:28 PM

## 2016-11-25 NOTE — Op Note (Signed)
OPERATIVE REPORT  SURGEON: Rod Can, MD   ASSISTANT: Nehemiah Massed, PA-C.  PREOPERATIVE DIAGNOSIS: Right hip arthritis.   POSTOPERATIVE DIAGNOSIS: Right hip arthritis.   PROCEDURE: Right total hip arthroplasty, anterior approach.   IMPLANTS: DePuy Tri Lock stem, size 4, std offset. DePuy Pinnacle Cup, size 54 mm. DePuy Altrx liner, size 32 by 54 mm, neutral. DePuy Biolox ceramic head ball, size 32 + 5 mm.  ANESTHESIA:  Spinal  ESTIMATED BLOOD LOSS: 250 mL.  ANTIBIOTICS: 2 g Ancef.  DRAINS: None.  COMPLICATIONS: None.   CONDITION: PACU - hemodynamically stable.   BRIEF CLINICAL NOTE: Paula [name] is a 66 y.o. female with a long-standing history of Right hip arthritis. After failing conservative management, the patient was indicated for total hip arthroplasty. The risks, benefits, and alternatives to the procedure were explained, and the patient elected to proceed.  PROCEDURE IN DETAIL: Surgical site was marked by myself in the pre-op holding area. Once inside the operating room, spinal anesthesia was obtained, and a foley catheter was inserted. The patient was then positioned on the Hana table. All bony prominences were well padded. The hip was prepped and draped in the normal sterile surgical fashion. A time-out was called verifying side and site of surgery. The patient received IV antibiotics within 60 minutes of beginning the procedure.  The direct anterior approach to the hip was performed through the Hueter interval. Lateral femoral circumflex vessels were treated with the Auqumantys. The anterior capsule was exposed and an inverted T capsulotomy was made.The femoral neck cut was made to the level of the templated cut. A corkscrew was placed into the head and the head was removed. The femoral head was found to have eburnated bone. The head was passed to the back table and was measured.  Acetabular exposure was achieved, and the pulvinar and labrum were  excised. Sequential reaming of the acetabulum was then performed up to a size 53 mm reamer. A 54 mm cup was then opened and impacted into place at approximately 40 degrees of abduction and 20 degrees of anteversion. The final polyethylene liner was impacted into place and acetabular osteophytes were removed.   I then gained femoral exposure taking care to protect the abductors and greater trochanter. This was performed using standard external rotation, extension, and adduction. The capsule was peeled off the inner aspect of the greater trochanter, taking care to preserve the short external rotators. A cookie cutter was used to enter the femoral canal, and then the femoral canal finder was placed. Sequential broaching was performed up to a size 4. Calcar planer was used on the femoral neck remnant. I placed a std offset neck and a trial head ball. The hip was reduced. Leg lengths and offset were checked fluoroscopically. A +5 head ball was required to achieve adequate soft tissue tension, and thus the length was lengthened a few millimeters. The hip was dislocated and trial components were removed. The final implants were placed, and the hip was reduced.  Fluoroscopy was used to confirm component position and leg lengths. At 90 degrees of external rotation and full extension, the hip was stable to an anterior directed force.  The wound was copiously irrigated with normal saline using pulse lavage. Marcaine solution was injected into the periarticular soft tissue. The wound was closed in layers using #1 Vicryl and V-Loc for the fascia, 2-0 Vicryl for the subcutaneous fat, 2-0 Monocryl for the deep dermal layer, 3-0 running Monocryl subcuticular stitch, and Dermabond for the skin.  Once the glue was fully dried, an Aquacell Ag dressing was applied. The patient was transported to the recovery room in stable condition. Sponge, needle, and instrument counts were correct at the end of the case x2. The  patient tolerated the procedure well and there were no known complications.  Please note that a surgical assistant was a medical necessity for this procedure to perform it in a safe and expeditious manner. Assistant was necessary to provide appropriate retraction of vital neurovascular structures, to prevent femoral fracture, and to allow for anatomic placement of the prosthesis.

## 2016-11-26 LAB — CBC
HCT: 30.7 % — ABNORMAL LOW (ref 36.0–46.0)
Hemoglobin: 9.6 g/dL — ABNORMAL LOW (ref 12.0–15.0)
MCH: 28.1 pg (ref 26.0–34.0)
MCHC: 31.3 g/dL (ref 30.0–36.0)
MCV: 89.8 fL (ref 78.0–100.0)
PLATELETS: 178 10*3/uL (ref 150–400)
RBC: 3.42 MIL/uL — ABNORMAL LOW (ref 3.87–5.11)
RDW: 12.8 % (ref 11.5–15.5)
WBC: 12.6 10*3/uL — ABNORMAL HIGH (ref 4.0–10.5)

## 2016-11-26 LAB — GLUCOSE, CAPILLARY: Glucose-Capillary: 145 mg/dL — ABNORMAL HIGH (ref 65–99)

## 2016-11-26 LAB — BASIC METABOLIC PANEL
Anion gap: 6 (ref 5–15)
BUN: 13 mg/dL (ref 6–20)
CALCIUM: 8.6 mg/dL — AB (ref 8.9–10.3)
CO2: 25 mmol/L (ref 22–32)
CREATININE: 0.76 mg/dL (ref 0.44–1.00)
Chloride: 107 mmol/L (ref 101–111)
Glucose, Bld: 168 mg/dL — ABNORMAL HIGH (ref 65–99)
Potassium: 5.2 mmol/L — ABNORMAL HIGH (ref 3.5–5.1)
SODIUM: 138 mmol/L (ref 135–145)

## 2016-11-26 MED ORDER — ASPIRIN 81 MG PO CHEW: 81.0000 mg | CHEWABLE_TABLET | Freq: Two times a day (BID) | ORAL | 1 refills | Status: AC

## 2016-11-26 MED ORDER — DOCUSATE SODIUM 100 MG PO CAPS: 100.0000 mg | ORAL_CAPSULE | Freq: Two times a day (BID) | ORAL | 1 refills | Status: DC

## 2016-11-26 MED ORDER — HYDROCODONE-ACETAMINOPHEN 5-325 MG PO TABS: 1.0000 | ORAL_TABLET | ORAL | 0 refills | Status: DC | PRN

## 2016-11-26 MED ORDER — SENNA 8.6 MG PO TABS
2.0000 | ORAL_TABLET | Freq: Every day | ORAL | 0 refills | Status: DC
Start: 2016-11-26 — End: 2022-03-24

## 2016-11-26 MED ORDER — ONDANSETRON HCL 4 MG PO TABS: 4.0000 mg | ORAL_TABLET | Freq: Four times a day (QID) | ORAL | 0 refills | Status: DC | PRN

## 2016-11-26 NOTE — Progress Notes (Signed)
   Subjective:  Patient reports pain as mild to moderate.  Denies N/V/CP/SOB.  Objective:   VITALS:   Vitals:   11/25/16 1731 11/25/16 2234 11/26/16 0214 11/26/16 0613  BP: (!) 101/46 (!) 96/46 (!) 101/50 (!) 101/51  Pulse: 72 73 66 62  Resp: 16 16 16 16   Temp: (!) 97.5 F (36.4 C) 98.1 F (36.7 C) 97.7 F (36.5 C) 98.3 F (36.8 C)  TempSrc: Oral Oral Oral Oral  SpO2: 99% 98% 99% 100%  Weight:      Height:       NAD ABD soft Sensation intact distally Intact pulses distally Dorsiflexion/Plantar flexion intact Incision: dressing C/D/I Compartment soft   Lab Results  Component Value Date   WBC 12.6 (H) 11/26/2016   HGB 9.6 (L) 11/26/2016   HCT 30.7 (L) 11/26/2016   MCV 89.8 11/26/2016   PLT 178 11/26/2016   BMET    Component Value Date/Time   NA 138 11/26/2016 0441   K 5.2 (H) 11/26/2016 0441   CL 107 11/26/2016 0441   CO2 25 11/26/2016 0441   GLUCOSE 168 (H) 11/26/2016 0441   BUN 13 11/26/2016 0441   CREATININE 0.76 11/26/2016 0441   CALCIUM 8.6 (L) 11/26/2016 0441   GFRNONAA >60 11/26/2016 0441   GFRAA >60 11/26/2016 0441     Assessment/Plan: 1 Day Post-Op   Principal Problem:   Osteoarthritis of right hip   WBAT with walker DVT ppx: ASA, SCds, TEDs PO pain control PT/OT Dispo: D/C home with HEP   Paula [name], Horald Pollen 11/26/2016, 8:13 AM   Rod Can, MD Cell [phone]

## 2016-11-26 NOTE — Progress Notes (Signed)
RN reviewed discharge instructions with patient and family. All questions answered.   Paperwork and prescriptions given.   NT rolled patient down with all belongings to family car. 

## 2016-11-26 NOTE — Care Management Note (Signed)
Case Management Note  Patient Details  Name: Paula [name] MRN: [id number] Date of Birth: 1950/06/01  Subjective/Objective:  66 y/o f admitted w/OA R hip. From home. PT-home exercise program. 3n1,rw brought to rm prior d/c from [facility]. No CM needs.                  Action/Plan:d/c home w/DME.   Expected Discharge Date:  11/26/16               Expected Discharge Plan:  Home/Self Care  In-House Referral:     Discharge planning Services  CM Consult  Post Acute Care Choice:    Choice offered to:     DME Arranged:  3-N-1, Walker rolling DME Agency:  [place]:    [facility] Agency:     Status of Service:  Completed, signed off  If discussed at [employer] of Stay Meetings, dates discussed:    Additional Comments:  Dessa Phi, RN 11/26/2016, 1:36 PM

## 2016-11-26 NOTE — Progress Notes (Signed)
Physical Therapy Treatment Patient Details Name: Paula [name] MRN: [id number] DOB: 02/09/1951 Today's Date: 11/26/2016    History of Present Illness Pt is a 66 year old female s/p R direct anterior THA    PT Comments    Pt ambulated in hallway and practiced safe stair technique with spouse observing.  Pt provided with handout on stair technique with SPC and rail as well as HEP.  Reviewed HEP and pt demonstrated each exercise.  Pt declines performing exercises here but agreeable to perform once settled at home.  Pt feels ready for d/c home today.   Follow Up Recommendations  DC plan and follow up therapy as arranged by surgeon;Home health PT     Equipment Recommendations  None recommended by PT    Recommendations for Other Services       Precautions / Restrictions Precautions Precautions: Fall Restrictions Other Position/Activity Restrictions: WBAT    Mobility  Bed Mobility Overal bed mobility: Needs Assistance Bed Mobility: Supine to Sit     Supine to sit: Min guard     General bed mobility comments: verbal cues for technique, increased time  Transfers Overall transfer level: Needs assistance Equipment used: Rolling walker (2 wheeled) Transfers: Sit to/from Stand Sit to Stand: Min guard         General transfer comment: verbal cues for UE and LE positioning  Ambulation/Gait Ambulation/Gait assistance: Min guard Ambulation Distance (Feet): 120 Feet Assistive device: Rolling walker (2 wheeled) Gait Pattern/deviations: Step-to pattern;Decreased stance time - right;Antalgic     General Gait Details: verbal cues for sequence, RW positioning, heel strike   Stairs Stairs: Yes   Stair Management: Forwards;Step to pattern;One rail Right;With cane Number of Stairs: 2 General stair comments: verbal cues for safe sequence, HHA used as SPC, pt has right hand rail, spouse observed, provided handout for one rail and SPC technique  Wheelchair Mobility     Modified Rankin (Stroke Patients Only)       Balance                                            Cognition Arousal/Alertness: Awake/alert Behavior During Therapy: WFL for tasks assessed/performed Overall Cognitive Status: Within Functional Limits for tasks assessed                                        Exercises      General Comments        Pertinent Vitals/Pain Pain Assessment: 0-10 Pain Score: 3  Pain Location: R hip Pain Descriptors / Indicators: Aching;Sore Pain Intervention(s): Limited activity within patient's tolerance;Monitored during session;Repositioned;Ice applied    Home Living                      Prior Function            PT Goals (current goals can now be found in the care plan section) Progress towards PT goals: Progressing toward goals    Frequency    7X/week      PT Plan Current plan remains appropriate    Co-evaluation              AM-PAC PT "6 Clicks" Daily Activity  Outcome Measure  Difficulty turning over in bed (including adjusting bedclothes, sheets and blankets)?: A  Little Difficulty moving from lying on back to sitting on the side of the bed? : A Little Difficulty sitting down on and standing up from a chair with arms (e.g., wheelchair, bedside commode, etc,.)?: A Little Help needed moving to and from a bed to chair (including a wheelchair)?: A Little Help needed walking in hospital room?: A Little Help needed climbing 3-5 steps with a railing? : A Little 6 Click Score: 18    End of Session Equipment Utilized During Treatment: Gait belt Activity Tolerance: Patient tolerated treatment well Patient left: with call bell/phone within reach;in chair;with family/visitor present Nurse Communication: Mobility status PT Visit Diagnosis: Other abnormalities of gait and mobility (R26.89)     Time: 2902-1115 PT Time Calculation (min) (ACUTE ONLY): 19 min  Charges:  $Gait  Training: 8-22 mins                    G Codes:       Carmelia Bake, PT, DPT 11/26/2016 Pager: [phone]  York Ram E 11/26/2016, 12:26 PM

## 2016-11-26 NOTE — Discharge Summary (Signed)
Physician Discharge Summary  Patient ID: Paula [name] MRN: [id number] DOB/AGE: Oct 02, 1950 66 y.o.  Admit date: 11/25/2016 Discharge date: 11/26/2016  Admission Diagnoses:  Osteoarthritis of right hip  Discharge Diagnoses:  Principal Problem:   Osteoarthritis of right hip   Past Medical History:  Diagnosis Date  . Aortic valve insufficiency   . Diabetes ([place])    type II  . Family history of heart disease    mother, son, and grandparent  . GERD (gastroesophageal reflux disease)   . Hiatal hernia   . History of small bowel obstruction   . HTN (hypertension)   . Hyperlipidemia   . Osteoarthritis   . Palpitations   . Paresthesias    left-sided  . Thyroid nodule   . Varicose vein     Surgeries: Procedure(s): RIGHT TOTAL HIP ARTHROPLASTY ANTERIOR APPROACH on 11/25/2016   Consultants (if any):   Discharged Condition: Improved  Hospital Course: Paula [name] is an 66 y.o. female who was admitted 11/25/2016 with a diagnosis of Osteoarthritis of right hip and went to the operating room on 11/25/2016 and underwent the above named procedures.    She was given perioperative antibiotics:  Anti-infectives    Start     Dose/Rate Route Frequency Ordered Stop   11/25/16 1400  ceFAZolin (ANCEF) IVPB 2g/100 mL premix     2 g 200 mL/hr over 30 Minutes Intravenous Every 6 hours 11/25/16 1132 11/25/16 2015   11/25/16 0522  ceFAZolin (ANCEF) IVPB 2g/100 mL premix     2 g 200 mL/hr over 30 Minutes Intravenous On call to O.R. 11/25/16 0522 11/25/16 0350    .  She was given sequential compression devices, early ambulation, and ASA for DVT prophylaxis.  She benefited maximally from the hospital stay and there were no complications.    Recent vital signs:  Vitals:   11/26/16 0613 11/26/16 0928  BP: (!) 101/51 (!) 101/40  Pulse: 62 65  Resp: 16 16  Temp: 98.3 F (36.8 C) 98.1 F (36.7 C)  SpO2: 100% 96%    Recent laboratory studies:  Lab Results  Component Value Date   HGB  9.6 (L) 11/26/2016   HGB 12.1 08/26/2014   Lab Results  Component Value Date   WBC 12.6 (H) 11/26/2016   PLT 178 11/26/2016   No results found for: INR Lab Results  Component Value Date   NA 138 11/26/2016   K 5.2 (H) 11/26/2016   CL 107 11/26/2016   CO2 25 11/26/2016   BUN 13 11/26/2016   CREATININE 0.76 11/26/2016   GLUCOSE 168 (H) 11/26/2016    Discharge Medications:   Allergies as of 11/26/2016      Reactions   Tetracyclines & Related Nausea Only   Upset stomach      Medication List    STOP taking these medications   ARTHRITIS PAIN 650 MG CR tablet Generic drug:  acetaminophen   aspirin EC 81 MG tablet Replaced by:  aspirin 81 MG chewable tablet     TAKE these medications   aspirin 81 MG chewable tablet Chew 1 tablet (81 mg total) by mouth 2 (two) times daily. Replaces:  aspirin EC 81 MG tablet Notes to patient:  For blood clot prevention   docusate sodium 100 MG capsule Commonly known as:  COLACE Take 1 capsule (100 mg total) by mouth 2 (two) times daily. Notes to patient:  Stool softener   HYDROcodone-acetaminophen 5-325 MG tablet Commonly known as:  NORCO/VICODIN Take 1-2 tablets by mouth  every 4 (four) hours as needed (breakthrough pain).   levothyroxine 50 MCG tablet Commonly known as:  SYNTHROID, LEVOTHROID Take 50 mcg by mouth daily before breakfast.   lisinopril 20 MG tablet Commonly known as:  PRINIVIL,ZESTRIL Take 10 mg by mouth daily with breakfast.   metFORMIN 500 MG tablet Commonly known as:  GLUCOPHAGE Take 500 mg by mouth 2 (two) times daily.   OMEGA 3 PO Take 75 mLs by mouth daily at 2 PM. With Pomegranate juice   ondansetron 4 MG tablet Commonly known as:  ZOFRAN Take 1 tablet (4 mg total) by mouth every 6 (six) hours as needed for nausea.   ranitidine 150 MG tablet Commonly known as:  ZANTAC Take 150 mg by mouth at bedtime.   rosuvastatin 10 MG tablet Commonly known as:  CRESTOR Take 5 mg by mouth daily with supper.    senna 8.6 MG Tabs tablet Commonly known as:  SENOKOT Take 2 tablets (17.2 mg total) by mouth at bedtime.   sertraline 50 MG tablet Commonly known as:  ZOLOFT Take 50 mg by mouth daily with breakfast.   SYSTANE 0.4-0.3 % Soln Generic drug:  Polyethyl Glycol-Propyl Glycol Place 1-2 drops into both eyes 3 (three) times daily as needed (for dry eyes.).   triamcinolone cream 0.1 % Commonly known as:  KENALOG Apply 1 application topically 2 (two) times daily as needed (for ezcema.).   VIACTIV [phone] MG-UNT-MCG Chew Generic drug:  Calcium-Vitamin D-Vitamin K Chew 1 tablet by mouth 2 (two) times daily.       Diagnostic Studies: Dg Pelvis Portable  Result Date: 11/25/2016 CLINICAL DATA:  Status post right hip replacement EXAM: PORTABLE PELVIS 1-2 VIEWS COMPARISON:  None. FINDINGS: Right hip prosthesis is noted in satisfactory position. Degenerative changes of the left hip joint is noted. The pelvic ring is intact. No soft tissue abnormality is seen. IMPRESSION: Status post right hip replacement. Electronically Signed   By: Inez Catalina M.D.   On: 11/25/2016 10:50   Dg C-arm 61-120 Min-no Report  Result Date: 11/25/2016 CLINICAL DATA:  Total hip replacement on the right EXAM: DG C-ARM 61-120 MIN-NO REPORT; OPERATIVE RIGHT HIP WITH PELVIS COMPARISON:  None. FLUOROSCOPY TIME:  0 minutes 18 seconds; 2 acquired images FINDINGS: Frontal image of right hip and lower pelvis obtained. There is a total hip replacement on the right with prosthetic components appearing well-seated on frontal view. No fracture or dislocation evident. There is mild narrowing of the left hip joint. IMPRESSION: Status post total hip replacement with prosthetic components appearing well-seated on frontal view. Mild narrowing left hip joint. No fracture or dislocation. Electronically Signed   By: Lowella Grip III M.D.   On: 11/25/2016 09:23   Dg Hip Operative Unilat With Pelvis Right  Result Date:  11/25/2016 CLINICAL DATA:  Total hip replacement on the right EXAM: DG C-ARM 61-120 MIN-NO REPORT; OPERATIVE RIGHT HIP WITH PELVIS COMPARISON:  None. FLUOROSCOPY TIME:  0 minutes 18 seconds; 2 acquired images FINDINGS: Frontal image of right hip and lower pelvis obtained. There is a total hip replacement on the right with prosthetic components appearing well-seated on frontal view. No fracture or dislocation evident. There is mild narrowing of the left hip joint. IMPRESSION: Status post total hip replacement with prosthetic components appearing well-seated on frontal view. Mild narrowing left hip joint. No fracture or dislocation. Electronically Signed   By: Lowella Grip III M.D.   On: 11/25/2016 09:23    Disposition: 01-Home or Self Care  Discharge  Instructions    Call MD / Call 911    Complete by:  As directed    If you experience chest pain or shortness of breath, CALL 911 and be transported to the hospital emergency room.  If you develope a fever above 101 F, pus (white drainage) or increased drainage or redness at the wound, or calf pain, call your surgeon's office.   Constipation Prevention    Complete by:  As directed    Drink plenty of fluids.  Prune juice may be helpful.  You may use a stool softener, such as Colace (over the counter) 100 mg twice a day.  Use MiraLax (over the counter) for constipation as needed.   Diet - low sodium heart healthy    Complete by:  As directed    Driving restrictions    Complete by:  As directed    No driving for 6 weeks   Increase activity slowly as tolerated    Complete by:  As directed    Lifting restrictions    Complete by:  As directed    No lifting for 6 weeks   TED hose    Complete by:  As directed    Use stockings (TED hose) for 2 weeks on both leg(s).  You may remove them at night for sleeping.      Follow-up Information    Stepahnie Campo, Aaron Edelman, MD. Schedule an appointment as soon as possible for a visit in 2 week(s).   Specialty:   Orthopedic Surgery Why:  For wound re-check Contact information: [address] [place] [address] [phone]        [facility] - Dme Follow up.   Why:  home rolling walker,bedside commode Contact information: [place] [address] [phone]            Signed: Elie Goody 11/26/2016, 3:14 PM

## 2016-12-10 DIAGNOSIS — Z96641 Presence of right artificial hip joint: Secondary | ICD-10-CM | POA: Diagnosis not present

## 2016-12-10 DIAGNOSIS — Z471 Aftercare following joint replacement surgery: Secondary | ICD-10-CM | POA: Diagnosis not present

## 2017-01-12 DIAGNOSIS — Z96641 Presence of right artificial hip joint: Secondary | ICD-10-CM | POA: Diagnosis not present

## 2017-01-12 DIAGNOSIS — E041 Nontoxic single thyroid nodule: Secondary | ICD-10-CM | POA: Diagnosis not present

## 2017-01-12 DIAGNOSIS — Z471 Aftercare following joint replacement surgery: Secondary | ICD-10-CM | POA: Diagnosis not present

## 2017-02-08 ENCOUNTER — Other Ambulatory Visit: Payer: Self-pay | Admitting: Radiology

## 2017-02-08 DIAGNOSIS — N63 Unspecified lump in unspecified breast: Secondary | ICD-10-CM | POA: Diagnosis not present

## 2017-02-08 DIAGNOSIS — N6310 Unspecified lump in the right breast, unspecified quadrant: Secondary | ICD-10-CM

## 2017-02-16 ENCOUNTER — Other Ambulatory Visit: Payer: Self-pay | Admitting: Radiology

## 2017-02-16 ENCOUNTER — Ambulatory Visit
Admission: RE | Admit: 2017-02-16 | Discharge: 2017-02-16 | Disposition: A | Discharge status: Home / Self Care | Payer: Medicare Other | Source: Ambulatory Visit | Attending: Radiology | Admitting: Radiology

## 2017-02-16 DIAGNOSIS — N6310 Unspecified lump in the right breast, unspecified quadrant: Secondary | ICD-10-CM

## 2017-02-16 DIAGNOSIS — R928 Other abnormal and inconclusive findings on diagnostic imaging of breast: Secondary | ICD-10-CM | POA: Diagnosis not present

## 2017-02-16 DIAGNOSIS — N6489 Other specified disorders of breast: Secondary | ICD-10-CM | POA: Diagnosis not present

## 2017-04-15 DIAGNOSIS — E119 Type 2 diabetes mellitus without complications: Secondary | ICD-10-CM | POA: Diagnosis not present

## 2017-05-17 ENCOUNTER — Other Ambulatory Visit: Payer: Medicare Other

## 2017-06-02 ENCOUNTER — Ambulatory Visit
Admission: RE | Admit: 2017-06-02 | Discharge: 2017-06-02 | Disposition: A | Discharge status: Home / Self Care | Payer: Medicare Other | Source: Ambulatory Visit | Attending: Radiology | Admitting: Radiology

## 2017-06-02 DIAGNOSIS — N6489 Other specified disorders of breast: Secondary | ICD-10-CM | POA: Diagnosis not present

## 2017-06-02 DIAGNOSIS — N6310 Unspecified lump in the right breast, unspecified quadrant: Secondary | ICD-10-CM

## 2017-07-06 DIAGNOSIS — R070 Pain in throat: Secondary | ICD-10-CM | POA: Diagnosis not present

## 2017-07-06 DIAGNOSIS — K219 Gastro-esophageal reflux disease without esophagitis: Secondary | ICD-10-CM | POA: Diagnosis not present

## 2017-08-11 DIAGNOSIS — E041 Nontoxic single thyroid nodule: Secondary | ICD-10-CM | POA: Diagnosis not present

## 2017-08-11 DIAGNOSIS — E7800 Pure hypercholesterolemia, unspecified: Secondary | ICD-10-CM | POA: Diagnosis not present

## 2017-08-11 DIAGNOSIS — E119 Type 2 diabetes mellitus without complications: Secondary | ICD-10-CM | POA: Diagnosis not present

## 2017-08-11 DIAGNOSIS — I10 Essential (primary) hypertension: Secondary | ICD-10-CM | POA: Diagnosis not present

## 2017-08-11 DIAGNOSIS — M8580 Other specified disorders of bone density and structure, unspecified site: Secondary | ICD-10-CM | POA: Diagnosis not present

## 2017-08-17 DIAGNOSIS — Z0000 Encounter for general adult medical examination without abnormal findings: Secondary | ICD-10-CM | POA: Diagnosis not present

## 2017-08-17 DIAGNOSIS — M2170 Unequal limb length (acquired), unspecified site: Secondary | ICD-10-CM | POA: Diagnosis not present

## 2017-08-17 DIAGNOSIS — E1140 Type 2 diabetes mellitus with diabetic neuropathy, unspecified: Secondary | ICD-10-CM | POA: Diagnosis not present

## 2017-08-17 DIAGNOSIS — I10 Essential (primary) hypertension: Secondary | ICD-10-CM | POA: Diagnosis not present

## 2017-08-17 DIAGNOSIS — L309 Dermatitis, unspecified: Secondary | ICD-10-CM | POA: Diagnosis not present

## 2017-09-15 ENCOUNTER — Other Ambulatory Visit: Payer: Self-pay | Admitting: Endocrinology

## 2017-09-15 DIAGNOSIS — E041 Nontoxic single thyroid nodule: Secondary | ICD-10-CM

## 2017-10-13 ENCOUNTER — Other Ambulatory Visit: Payer: Medicare Other

## 2017-10-20 ENCOUNTER — Ambulatory Visit
Admission: RE | Admit: 2017-10-20 | Discharge: 2017-10-20 | Disposition: A | Discharge status: Home / Self Care | Payer: Medicare Other | Source: Ambulatory Visit | Attending: Endocrinology | Admitting: Endocrinology

## 2017-10-20 DIAGNOSIS — E042 Nontoxic multinodular goiter: Secondary | ICD-10-CM | POA: Diagnosis not present

## 2017-10-20 DIAGNOSIS — E041 Nontoxic single thyroid nodule: Secondary | ICD-10-CM

## 2017-10-27 DIAGNOSIS — E063 Autoimmune thyroiditis: Secondary | ICD-10-CM | POA: Diagnosis not present

## 2017-10-27 DIAGNOSIS — Z23 Encounter for immunization: Secondary | ICD-10-CM | POA: Diagnosis not present

## 2017-10-27 DIAGNOSIS — E041 Nontoxic single thyroid nodule: Secondary | ICD-10-CM | POA: Diagnosis not present

## 2017-10-27 DIAGNOSIS — M8580 Other specified disorders of bone density and structure, unspecified site: Secondary | ICD-10-CM | POA: Diagnosis not present

## 2017-11-02 DIAGNOSIS — S161XXA Strain of muscle, fascia and tendon at neck level, initial encounter: Secondary | ICD-10-CM | POA: Diagnosis not present

## 2017-11-08 DIAGNOSIS — M1611 Unilateral primary osteoarthritis, right hip: Secondary | ICD-10-CM | POA: Diagnosis not present

## 2017-11-18 DIAGNOSIS — H2513 Age-related nuclear cataract, bilateral: Secondary | ICD-10-CM | POA: Diagnosis not present

## 2017-11-18 DIAGNOSIS — E119 Type 2 diabetes mellitus without complications: Secondary | ICD-10-CM | POA: Diagnosis not present

## 2017-12-27 DIAGNOSIS — E041 Nontoxic single thyroid nodule: Secondary | ICD-10-CM | POA: Diagnosis not present

## 2018-10-23 ENCOUNTER — Other Ambulatory Visit: Payer: Self-pay | Admitting: Endocrinology

## 2018-10-23 DIAGNOSIS — E041 Nontoxic single thyroid nodule: Secondary | ICD-10-CM

## 2018-10-31 DIAGNOSIS — E7800 Pure hypercholesterolemia, unspecified: Secondary | ICD-10-CM | POA: Diagnosis not present

## 2018-10-31 DIAGNOSIS — I10 Essential (primary) hypertension: Secondary | ICD-10-CM | POA: Diagnosis not present

## 2018-10-31 DIAGNOSIS — E119 Type 2 diabetes mellitus without complications: Secondary | ICD-10-CM | POA: Diagnosis not present

## 2018-10-31 DIAGNOSIS — E039 Hypothyroidism, unspecified: Secondary | ICD-10-CM | POA: Diagnosis not present

## 2018-11-01 ENCOUNTER — Ambulatory Visit
Admission: RE | Admit: 2018-11-01 | Discharge: 2018-11-01 | Disposition: A | Discharge status: Home / Self Care | Payer: Medicare Other | Source: Ambulatory Visit | Attending: Endocrinology | Admitting: Endocrinology

## 2018-11-01 DIAGNOSIS — N390 Urinary tract infection, site not specified: Secondary | ICD-10-CM | POA: Diagnosis not present

## 2018-11-01 DIAGNOSIS — E041 Nontoxic single thyroid nodule: Secondary | ICD-10-CM

## 2018-11-01 DIAGNOSIS — E042 Nontoxic multinodular goiter: Secondary | ICD-10-CM | POA: Diagnosis not present

## 2018-11-07 ENCOUNTER — Other Ambulatory Visit: Payer: Self-pay | Admitting: Internal Medicine

## 2018-11-07 DIAGNOSIS — Z1231 Encounter for screening mammogram for malignant neoplasm of breast: Secondary | ICD-10-CM

## 2018-11-07 DIAGNOSIS — M8580 Other specified disorders of bone density and structure, unspecified site: Secondary | ICD-10-CM | POA: Diagnosis not present

## 2018-11-07 DIAGNOSIS — E041 Nontoxic single thyroid nodule: Secondary | ICD-10-CM | POA: Diagnosis not present

## 2018-11-07 DIAGNOSIS — I10 Essential (primary) hypertension: Secondary | ICD-10-CM | POA: Diagnosis not present

## 2018-11-07 DIAGNOSIS — E1140 Type 2 diabetes mellitus with diabetic neuropathy, unspecified: Secondary | ICD-10-CM | POA: Diagnosis not present

## 2018-11-07 DIAGNOSIS — E063 Autoimmune thyroiditis: Secondary | ICD-10-CM | POA: Diagnosis not present

## 2018-11-15 DIAGNOSIS — M8589 Other specified disorders of bone density and structure, multiple sites: Secondary | ICD-10-CM | POA: Diagnosis not present

## 2018-11-15 DIAGNOSIS — E7800 Pure hypercholesterolemia, unspecified: Secondary | ICD-10-CM | POA: Diagnosis not present

## 2018-11-15 DIAGNOSIS — Z23 Encounter for immunization: Secondary | ICD-10-CM | POA: Diagnosis not present

## 2018-11-15 DIAGNOSIS — Z0000 Encounter for general adult medical examination without abnormal findings: Secondary | ICD-10-CM | POA: Diagnosis not present

## 2018-11-15 DIAGNOSIS — Z1212 Encounter for screening for malignant neoplasm of rectum: Secondary | ICD-10-CM | POA: Diagnosis not present

## 2018-11-15 DIAGNOSIS — E1140 Type 2 diabetes mellitus with diabetic neuropathy, unspecified: Secondary | ICD-10-CM | POA: Diagnosis not present

## 2018-11-15 DIAGNOSIS — M85851 Other specified disorders of bone density and structure, right thigh: Secondary | ICD-10-CM | POA: Diagnosis not present

## 2018-11-15 DIAGNOSIS — I10 Essential (primary) hypertension: Secondary | ICD-10-CM | POA: Diagnosis not present

## 2018-11-23 DIAGNOSIS — H40053 Ocular hypertension, bilateral: Secondary | ICD-10-CM | POA: Diagnosis not present

## 2018-12-21 ENCOUNTER — Ambulatory Visit: Payer: Medicare Other

## 2019-01-04 DIAGNOSIS — R1013 Epigastric pain: Secondary | ICD-10-CM | POA: Diagnosis not present

## 2019-01-04 DIAGNOSIS — K219 Gastro-esophageal reflux disease without esophagitis: Secondary | ICD-10-CM | POA: Diagnosis not present

## 2019-01-29 DIAGNOSIS — Z1159 Encounter for screening for other viral diseases: Secondary | ICD-10-CM | POA: Diagnosis not present

## 2019-02-01 DIAGNOSIS — K2970 Gastritis, unspecified, without bleeding: Secondary | ICD-10-CM | POA: Diagnosis not present

## 2019-02-01 DIAGNOSIS — K219 Gastro-esophageal reflux disease without esophagitis: Secondary | ICD-10-CM | POA: Diagnosis not present

## 2019-02-01 DIAGNOSIS — K3189 Other diseases of stomach and duodenum: Secondary | ICD-10-CM | POA: Diagnosis not present

## 2019-02-01 DIAGNOSIS — R1010 Upper abdominal pain, unspecified: Secondary | ICD-10-CM | POA: Diagnosis not present

## 2019-02-01 DIAGNOSIS — K319 Disease of stomach and duodenum, unspecified: Secondary | ICD-10-CM | POA: Diagnosis not present

## 2019-02-01 DIAGNOSIS — K317 Polyp of stomach and duodenum: Secondary | ICD-10-CM | POA: Diagnosis not present

## 2019-02-06 DIAGNOSIS — K317 Polyp of stomach and duodenum: Secondary | ICD-10-CM | POA: Diagnosis not present

## 2019-02-06 DIAGNOSIS — K319 Disease of stomach and duodenum, unspecified: Secondary | ICD-10-CM | POA: Diagnosis not present

## 2019-02-13 ENCOUNTER — Ambulatory Visit
Admission: RE | Admit: 2019-02-13 | Discharge: 2019-02-13 | Disposition: A | Discharge status: Home / Self Care | Payer: Medicare Other | Source: Ambulatory Visit | Attending: Internal Medicine | Admitting: Internal Medicine

## 2019-02-13 ENCOUNTER — Other Ambulatory Visit: Payer: Self-pay

## 2019-02-13 DIAGNOSIS — Z1231 Encounter for screening mammogram for malignant neoplasm of breast: Secondary | ICD-10-CM

## 2019-04-20 ENCOUNTER — Ambulatory Visit: Payer: Medicare Other | Attending: Internal Medicine

## 2019-04-20 ENCOUNTER — Other Ambulatory Visit: Payer: Self-pay

## 2019-04-20 DIAGNOSIS — Z23 Encounter for immunization: Secondary | ICD-10-CM | POA: Insufficient documentation

## 2019-04-20 NOTE — Progress Notes (Signed)
   Covid-19 Vaccination Clinic  Name:  Lenita Niskanen    MRN: [id number] DOB: 1950/05/06  04/20/2019  Ms. Guerrant was observed post Covid-19 immunization for 15 minutes without incidence. She was provided with Vaccine Information Sheet and instruction to access the V-Safe system.   Ms. Conk was instructed to call 911 with any severe reactions post vaccine: Marland Kitchen Difficulty breathing  . Swelling of your face and throat  . A fast heartbeat  . A bad rash all over your body  . Dizziness and weakness    Immunizations Administered    Name Date Dose VIS Date Route   Pfizer COVID-19 Vaccine 04/20/2019  9:19 AM 0.3 mL 02/02/2019 Intramuscular   Manufacturer: [place]   Lot: [id number]   [place]: [id number]

## 2019-05-16 ENCOUNTER — Ambulatory Visit: Payer: Medicare Other | Attending: Internal Medicine

## 2019-05-16 ENCOUNTER — Ambulatory Visit: Payer: Medicare Other

## 2019-05-16 DIAGNOSIS — Z23 Encounter for immunization: Secondary | ICD-10-CM

## 2019-05-16 NOTE — Progress Notes (Signed)
   Covid-19 Vaccination Clinic  Name:  Paula [name]    MRN: [id number] DOB: November 01, 1950  05/16/2019  [name] was observed post Covid-19 immunization for 15 minutes without incident. She was provided with Vaccine Information Sheet and instruction to access the V-Safe system.   [name] was instructed to call 911 with any severe reactions post vaccine: Marland Kitchen Difficulty breathing  . Swelling of face and throat  . A fast heartbeat  . A bad rash all over body  . Dizziness and weakness   Immunizations Administered    Name Date Dose VIS Date Route   Pfizer COVID-19 Vaccine 05/16/2019 12:11 PM 0.3 mL 02/02/2019 Intramuscular   Manufacturer: [employer]   Lot: [id number]   [place]: [id number]

## 2019-06-11 DIAGNOSIS — K219 Gastro-esophageal reflux disease without esophagitis: Secondary | ICD-10-CM | POA: Diagnosis not present

## 2019-06-11 DIAGNOSIS — Z86010 Personal history of colonic polyps: Secondary | ICD-10-CM | POA: Diagnosis not present

## 2019-07-26 DIAGNOSIS — Z86010 Personal history of colonic polyps: Secondary | ICD-10-CM | POA: Diagnosis not present

## 2019-07-26 DIAGNOSIS — K219 Gastro-esophageal reflux disease without esophagitis: Secondary | ICD-10-CM | POA: Diagnosis not present

## 2019-08-06 ENCOUNTER — Ambulatory Visit
Admission: RE | Admit: 2019-08-06 | Discharge: 2019-08-06 | Disposition: A | Discharge status: Home / Self Care | Payer: Medicare Other | Source: Ambulatory Visit | Attending: Emergency Medicine | Admitting: Emergency Medicine

## 2019-08-06 ENCOUNTER — Other Ambulatory Visit: Payer: Self-pay

## 2019-08-06 VITALS — BP 104/64 | HR 77 | Temp 98.1°F | Resp 18 | Ht 62.5 in | Wt 179.0 lb

## 2019-08-06 DIAGNOSIS — N390 Urinary tract infection, site not specified: Secondary | ICD-10-CM | POA: Insufficient documentation

## 2019-08-06 DIAGNOSIS — R319 Hematuria, unspecified: Secondary | ICD-10-CM | POA: Insufficient documentation

## 2019-08-06 LAB — POCT URINALYSIS DIP (MANUAL ENTRY)
Bilirubin, UA: NEGATIVE
Glucose, UA: NEGATIVE mg/dL
Ketones, POC UA: NEGATIVE mg/dL
Nitrite, UA: NEGATIVE
Protein Ur, POC: NEGATIVE mg/dL
Spec Grav, UA: 1.01 (ref 1.010–1.025)
Urobilinogen, UA: 0.2 E.U./dL
pH, UA: 6.5 (ref 5.0–8.0)

## 2019-08-06 MED ORDER — CEPHALEXIN 500 MG PO CAPS: 500.0000 mg | ORAL_CAPSULE | Freq: Three times a day (TID) | ORAL | 0 refills | Status: AC

## 2019-08-06 NOTE — Discharge Instructions (Signed)
Take the antibiotic as directed.  Follow up with your primary care provider if your symptoms are not improving.     

## 2019-08-06 NOTE — ED Triage Notes (Signed)
Patient in today c/o hematuria this morning. Patient has had some discomfort with urination x 1 day. Patient denies fever. Patient has not taken any OTC medications.

## 2019-08-06 NOTE — ED Provider Notes (Signed)
[facility]    CSN: [id number] Arrival date & time: 08/06/19  1450      History   Chief Complaint Chief Complaint  Patient presents with  . Appointment  . Hematuria    HPI Paula [name] is a 69 y.o. female.  Patient presents with dysuria x 1 day.  She also reports hematuria this morning.  She denies fever, chills, abdominal pain, back pain, vaginal discharge, pelvic pain, rash, lesions, or other symptoms.  No treatments attempted at home.     The history is provided by the patient.    Past Medical History:  Diagnosis Date  . Aortic valve insufficiency   . Diabetes ([place])    type II  . Family history of heart disease    mother, son, and grandparent  . GERD (gastroesophageal reflux disease)   . Hiatal hernia   . History of small bowel obstruction   . HTN (hypertension)   . Hyperlipidemia   . Osteoarthritis   . Palpitations   . Paresthesias    left-sided  . Thyroid nodule   . Varicose vein     Patient Active Problem List   Diagnosis Date Noted  . Osteoarthritis of right hip 11/25/2016  . Essential hypertension 08/01/2013  . Hyperlipidemia 08/01/2013  . Diabetes ([place]) 08/01/2013  . Sleep apnea 08/01/2013  . Morbid obesity ([place]) 08/01/2013  . Palpitations 08/01/2013  . Paresthesias 08/01/2013    Past Surgical History:  Procedure Laterality Date  . ABDOMINAL HYSTERECTOMY  2003  . COLON SURGERY  2005   colon blockage due to adhesions  . GALLBLADDER SURGERY  1974  . HERNIA REPAIR  2003   incisional  . TOTAL HIP ARTHROPLASTY Right 11/25/2016   Procedure: RIGHT TOTAL HIP ARTHROPLASTY ANTERIOR APPROACH;  Surgeon: Rod Can, MD;  Location: [facility];  Service: Orthopedics;  Laterality: Right;  NEEDS RNFA  . TUBAL LIGATION  1990  . UMBILICAL HERNIA REPAIR  1997    OB History   No obstetric history on file.      Home Medications    Prior to Admission medications   Medication Sig Start Date End Date Taking? Authorizing Provider    aspirin 81 MG chewable tablet Chew 1 tablet (81 mg total) by mouth 2 (two) times daily. 11/26/16  Yes [place], Aaron Edelman, MD  Calcium-Vitamin D-Vitamin K (VIACTIV) [phone] MG-UNT-MCG CHEW Chew 1 tablet by mouth 2 (two) times daily.   Yes [provider]  levothyroxine (SYNTHROID, LEVOTHROID) 50 MCG tablet Take 50 mcg by mouth daily before breakfast.   Yes [provider]  lisinopril (PRINIVIL,ZESTRIL) 20 MG tablet Take 10 mg by mouth daily with breakfast.   Yes [provider]  metFORMIN (GLUCOPHAGE) 500 MG tablet Take 500 mg by mouth 2 (two) times daily.   Yes [provider]  Polyethyl Glycol-Propyl Glycol (SYSTANE) 0.4-0.3 % SOLN Place 1-2 drops into both eyes 3 (three) times daily as needed (for dry eyes.).   Yes [provider]  rosuvastatin (CRESTOR) 10 MG tablet Take 5 mg by mouth daily with supper.   Yes [provider]  sertraline (ZOLOFT) 50 MG tablet Take 50 mg by mouth daily with breakfast.   Yes [provider]  triamcinolone cream (KENALOG) 0.1 % Apply 1 application topically 2 (two) times daily as needed (for ezcema.).    Yes [provider]  cephALEXin (KEFLEX) 500 MG capsule Take 1 capsule (500 mg total) by mouth 3 (three) times daily for 7 days. 08/06/19 08/13/19  Sharion Balloon, NP  docusate sodium (COLACE) 100 MG capsule Take 1 capsule (100 mg total) by mouth 2 (two) times daily. 11/26/16   Swinteck, Aaron Edelman, MD  HYDROcodone-acetaminophen (NORCO/VICODIN) 5-325 MG tablet Take 1-2 tablets by mouth every 4 (four) hours as needed (breakthrough pain). 11/26/16   Swinteck, Aaron Edelman, MD  Omega-3 Fatty Acids (OMEGA 3 PO) Take 75 mLs by mouth daily at 2 PM. With Pomegranate juice    [provider]  omeprazole (PRILOSEC) 20 MG capsule Take 20 mg by mouth daily. 06/13/19   [provider]  ondansetron (ZOFRAN) 4 MG tablet Take 1 tablet (4 mg total) by mouth every 6 (six) hours as needed for nausea. 11/26/16    Swinteck, Aaron Edelman, MD  ranitidine (ZANTAC) 150 MG tablet Take 150 mg by mouth at bedtime.    [provider]  senna (SENOKOT) 8.6 MG TABS tablet Take 2 tablets (17.2 mg total) by mouth at bedtime. 11/26/16   Rod Can, MD    Family History Family History  Problem Relation Age of Onset  . Cancer Mother        lung  . Heart attack Mother        age 27  . Stroke Father        "mini strokes"  . Cancer Father        prostate  . Diabetes Maternal Grandmother   . Stroke Maternal Grandmother   . Hypertension Maternal Grandmother   . Heart disease Paternal Grandfather   . Hypertension Brother   . Hyperlipidemia Brother   . Heart attack Son        at age 73    Social History Social History   Tobacco Use  . Smoking status: Former Smoker    Packs/day: 3.00    Years: 26.00    Pack years: 78.00    Quit date: 02/22/1986    Years since quitting: 33.4  . Smokeless tobacco: Never Used  Vaping Use  . Vaping Use: Never used  Substance Use Topics  . Alcohol use: Yes    Comment: rarely  . Drug use: No     Allergies   Tetracyclines & related   Review of Systems Review of Systems  Constitutional: Negative for chills and fever.  HENT: Negative for ear pain and sore throat.   Eyes: Negative for pain and visual disturbance.  Respiratory: Negative for cough and shortness of breath.   Cardiovascular: Negative for chest pain and palpitations.  Gastrointestinal: Negative for abdominal pain and vomiting.  Genitourinary: Positive for dysuria and hematuria. Negative for flank pain, pelvic pain and vaginal discharge.  Musculoskeletal: Negative for arthralgias and back pain.  Skin: Negative for color change and rash.  Neurological: Negative for seizures and syncope.  All other systems reviewed and are negative.    Physical Exam Triage Vital Signs ED Triage Vitals  Enc Vitals Group     BP      Pulse      Resp      Temp      Temp src      SpO2      Weight      Height       Head Circumference      Peak Flow      Pain Score      Pain Loc      Pain Edu?      Excl. in [place]?    No data found.  Updated Vital Signs BP 104/64 (BP Location: Left  Arm)   Pulse 77   Temp 98.1 F (36.7 C) (Oral)   Resp 18   Ht 5' 2.5" (1.588 m)   Wt 179 lb (81.2 kg)   SpO2 96%   BMI 32.22 kg/m   Visual Acuity Right Eye Distance:   Left Eye Distance:   Bilateral Distance:    Right Eye Near:   Left Eye Near:    Bilateral Near:     Physical Exam Vitals and nursing note reviewed.  Constitutional:      General: She is not in acute distress.    Appearance: She is well-developed. She is not ill-appearing.  HENT:     Head: Normocephalic and atraumatic.     Mouth/Throat:     Mouth: Mucous membranes are moist.  Eyes:     Conjunctiva/sclera: Conjunctivae normal.  Cardiovascular:     Rate and Rhythm: Normal rate and regular rhythm.     Heart sounds: No murmur heard.   Pulmonary:     Effort: Pulmonary effort is normal. No respiratory distress.     Breath sounds: Normal breath sounds.  Abdominal:     Palpations: Abdomen is soft.     Tenderness: There is no abdominal tenderness. There is no right CVA tenderness, left CVA tenderness, guarding or rebound.  Musculoskeletal:     Cervical back: Neck supple.  Skin:    General: Skin is warm and dry.     Findings: No rash.  Neurological:     General: No focal deficit present.     Mental Status: She is alert and oriented to person, place, and time.     Gait: Gait normal.  Psychiatric:        Mood and Affect: Mood normal.        Behavior: Behavior normal.      UC Treatments / Results  Labs (all labs ordered are listed, but only abnormal results are displayed) Labs Reviewed  POCT URINALYSIS DIP (MANUAL ENTRY) - Abnormal; Notable for the following components:      Result Value   Clarity, UA cloudy (*)    Blood, UA large (*)    Leukocytes, UA Small (1+) (*)    All other components within normal limits  URINE  CULTURE    EKG   Radiology No results found.  Procedures Procedures (including critical care time)  Medications Ordered in UC Medications - No data to display  Initial Impression / Assessment and Plan / UC Course  I have reviewed the triage vital signs and the nursing notes.  Pertinent labs & imaging results that were available during my care of the patient were reviewed by me and considered in my medical decision making (see chart for details).   UTI with hematuria.  Urine culture pending.  Treating with Keflex.  Instructed patient to follow-up with her PCP if her symptoms or not improving.  Patient agrees to plan of care.     Final Clinical Impressions(s) / UC Diagnoses   Final diagnoses:  Urinary tract infection with hematuria, site unspecified     Discharge Instructions     Take the antibiotic as directed.    Follow up with your primary care provider if your symptoms are not improving.       ED Prescriptions    Medication Sig Dispense Auth. Provider   cephALEXin (KEFLEX) 500 MG capsule Take 1 capsule (500 mg total) by mouth 3 (three) times daily for 7 days. 21 capsule Sharion Balloon, NP     PDMP  not reviewed this encounter.   Sharion Balloon, NP 08/06/19 1520

## 2019-08-09 LAB — URINE CULTURE: Culture: >=100000 — AB

## 2019-09-04 DIAGNOSIS — N390 Urinary tract infection, site not specified: Secondary | ICD-10-CM | POA: Diagnosis not present

## 2019-09-04 DIAGNOSIS — R300 Dysuria: Secondary | ICD-10-CM | POA: Diagnosis not present

## 2019-11-15 DIAGNOSIS — N390 Urinary tract infection, site not specified: Secondary | ICD-10-CM | POA: Diagnosis not present

## 2019-11-15 DIAGNOSIS — E7800 Pure hypercholesterolemia, unspecified: Secondary | ICD-10-CM | POA: Diagnosis not present

## 2019-11-15 DIAGNOSIS — Z0000 Encounter for general adult medical examination without abnormal findings: Secondary | ICD-10-CM | POA: Diagnosis not present

## 2019-11-15 DIAGNOSIS — I10 Essential (primary) hypertension: Secondary | ICD-10-CM | POA: Diagnosis not present

## 2019-11-15 DIAGNOSIS — E119 Type 2 diabetes mellitus without complications: Secondary | ICD-10-CM | POA: Diagnosis not present

## 2019-11-21 DIAGNOSIS — Z0000 Encounter for general adult medical examination without abnormal findings: Secondary | ICD-10-CM | POA: Diagnosis not present

## 2019-11-21 DIAGNOSIS — Z23 Encounter for immunization: Secondary | ICD-10-CM | POA: Diagnosis not present

## 2019-11-21 DIAGNOSIS — I10 Essential (primary) hypertension: Secondary | ICD-10-CM | POA: Diagnosis not present

## 2019-11-21 DIAGNOSIS — E119 Type 2 diabetes mellitus without complications: Secondary | ICD-10-CM | POA: Diagnosis not present

## 2019-11-21 DIAGNOSIS — I6529 Occlusion and stenosis of unspecified carotid artery: Secondary | ICD-10-CM | POA: Diagnosis not present

## 2019-11-21 DIAGNOSIS — E7800 Pure hypercholesterolemia, unspecified: Secondary | ICD-10-CM | POA: Diagnosis not present

## 2019-11-26 ENCOUNTER — Other Ambulatory Visit: Payer: Self-pay

## 2019-11-26 ENCOUNTER — Other Ambulatory Visit: Payer: Self-pay | Admitting: Internal Medicine

## 2019-11-26 DIAGNOSIS — I6523 Occlusion and stenosis of bilateral carotid arteries: Secondary | ICD-10-CM

## 2019-11-28 ENCOUNTER — Ambulatory Visit
Admission: RE | Admit: 2019-11-28 | Discharge: 2019-11-28 | Disposition: A | Discharge status: Home / Self Care | Payer: Medicare Other | Source: Ambulatory Visit | Attending: Internal Medicine | Admitting: Internal Medicine

## 2019-11-28 DIAGNOSIS — I6523 Occlusion and stenosis of bilateral carotid arteries: Secondary | ICD-10-CM

## 2019-12-10 ENCOUNTER — Other Ambulatory Visit: Payer: Self-pay | Admitting: Endocrinology

## 2019-12-10 DIAGNOSIS — E041 Nontoxic single thyroid nodule: Secondary | ICD-10-CM

## 2019-12-10 DIAGNOSIS — E039 Hypothyroidism, unspecified: Secondary | ICD-10-CM | POA: Diagnosis not present

## 2019-12-17 ENCOUNTER — Ambulatory Visit: Payer: Medicare Other | Attending: Internal Medicine

## 2019-12-17 DIAGNOSIS — Z23 Encounter for immunization: Secondary | ICD-10-CM

## 2019-12-17 NOTE — Progress Notes (Signed)
   Covid-19 Vaccination Clinic  Name:  Paula [name]    MRN: [id number] DOB: 12-17-1950  12/17/2019  Paula [name] was observed post Covid-19 immunization for 15 minutes without incident. She was provided with Vaccine Information Sheet and instruction to access the V-Safe system.   Paula [name] was instructed to call 911 with any severe reactions post vaccine: Marland Kitchen Difficulty breathing  . Swelling of face and throat  . A fast heartbeat  . A bad rash all over body  . Dizziness and weakness

## 2019-12-20 DIAGNOSIS — E119 Type 2 diabetes mellitus without complications: Secondary | ICD-10-CM | POA: Diagnosis not present

## 2019-12-20 DIAGNOSIS — H40003 Preglaucoma, unspecified, bilateral: Secondary | ICD-10-CM | POA: Diagnosis not present

## 2020-02-21 ENCOUNTER — Other Ambulatory Visit: Payer: Self-pay | Admitting: Internal Medicine

## 2020-02-21 DIAGNOSIS — N6310 Unspecified lump in the right breast, unspecified quadrant: Secondary | ICD-10-CM

## 2020-04-01 ENCOUNTER — Other Ambulatory Visit: Payer: Self-pay

## 2020-04-01 ENCOUNTER — Ambulatory Visit
Admission: RE | Admit: 2020-04-01 | Discharge: 2020-04-01 | Disposition: A | Discharge status: Home / Self Care | Payer: Medicare Other | Source: Ambulatory Visit | Attending: Internal Medicine | Admitting: Internal Medicine

## 2020-04-01 ENCOUNTER — Other Ambulatory Visit: Payer: Self-pay | Admitting: Internal Medicine

## 2020-04-01 DIAGNOSIS — N6310 Unspecified lump in the right breast, unspecified quadrant: Secondary | ICD-10-CM

## 2020-04-01 DIAGNOSIS — R928 Other abnormal and inconclusive findings on diagnostic imaging of breast: Secondary | ICD-10-CM | POA: Diagnosis not present

## 2020-04-01 DIAGNOSIS — N6489 Other specified disorders of breast: Secondary | ICD-10-CM | POA: Diagnosis not present

## 2020-04-07 ENCOUNTER — Ambulatory Visit
Admission: RE | Admit: 2020-04-07 | Discharge: 2020-04-07 | Disposition: A | Discharge status: Home / Self Care | Payer: Medicare Other | Source: Ambulatory Visit | Attending: Internal Medicine | Admitting: Internal Medicine

## 2020-04-07 ENCOUNTER — Other Ambulatory Visit: Payer: Self-pay

## 2020-04-07 ENCOUNTER — Other Ambulatory Visit (HOSPITAL_COMMUNITY): Payer: Self-pay | Admitting: Diagnostic Radiology

## 2020-04-07 DIAGNOSIS — N6310 Unspecified lump in the right breast, unspecified quadrant: Secondary | ICD-10-CM

## 2020-04-07 DIAGNOSIS — N641 Fat necrosis of breast: Secondary | ICD-10-CM | POA: Diagnosis not present

## 2020-04-07 DIAGNOSIS — N6315 Unspecified lump in the right breast, overlapping quadrants: Secondary | ICD-10-CM | POA: Diagnosis not present

## 2020-06-19 ENCOUNTER — Other Ambulatory Visit: Payer: Self-pay

## 2020-06-19 ENCOUNTER — Ambulatory Visit
Admission: RE | Admit: 2020-06-19 | Discharge: 2020-06-19 | Disposition: A | Discharge status: Home / Self Care | Payer: Medicare Other | Source: Ambulatory Visit | Attending: Emergency Medicine | Admitting: Emergency Medicine

## 2020-06-19 VITALS — BP 123/78 | HR 70 | Temp 98.2°F | Resp 19

## 2020-06-19 DIAGNOSIS — L03114 Cellulitis of left upper limb: Secondary | ICD-10-CM

## 2020-06-19 DIAGNOSIS — S50862A Insect bite (nonvenomous) of left forearm, initial encounter: Secondary | ICD-10-CM

## 2020-06-19 DIAGNOSIS — W57XXXA Bitten or stung by nonvenomous insect and other nonvenomous arthropods, initial encounter: Secondary | ICD-10-CM

## 2020-06-19 MED ORDER — DOXYCYCLINE HYCLATE 100 MG PO CAPS: 100.0000 mg | ORAL_CAPSULE | Freq: Two times a day (BID) | ORAL | 0 refills | Status: DC

## 2020-06-19 NOTE — Discharge Instructions (Addendum)
Take the doxycycline as directed.  Your blood work for tickborne illnesses is pending.    Schedule an appointment with your primary care provider for a recheck of your wound in 1 week.    Go to the emergency department right away if you have increased redness, increased swelling, red streaks, fever, or other concerning symptoms.

## 2020-06-19 NOTE — ED Provider Notes (Signed)
Paula [name]    CSN: [id number] Arrival date & time: 06/19/20  1453      History   Chief Complaint Chief Complaint  Patient presents with  . Insect Bite    HPI Paula [name] is a 70 y.o. female.   Patient presents with redness, swelling, warmth, tenderness of her left forearm since removing a tick 5 days ago.  She also has "red spots" near the tick bite.  She denies fever, other rash, headache, nausea, vomiting, abdominal pain, or other symptoms.  No treatments attempted at home.  Her medical history includes hypertension, diabetes, morbid obesity.    The history is provided by the patient and medical records.    Past Medical History:  Diagnosis Date  . Aortic valve insufficiency   . Diabetes ([place])    type II  . Family history of heart disease    mother, son, and grandparent  . GERD (gastroesophageal reflux disease)   . Hiatal hernia   . History of small bowel obstruction   . HTN (hypertension)   . Hyperlipidemia   . Osteoarthritis   . Palpitations   . Paresthesias    left-sided  . Thyroid nodule   . Varicose vein     Patient Active Problem List   Diagnosis Date Noted  . Osteoarthritis of right hip 11/25/2016  . Essential hypertension 08/01/2013  . Hyperlipidemia 08/01/2013  . Diabetes ([place]) 08/01/2013  . Sleep apnea 08/01/2013  . Morbid obesity ([place]) 08/01/2013  . Palpitations 08/01/2013  . Paresthesias 08/01/2013    Past Surgical History:  Procedure Laterality Date  . ABDOMINAL HYSTERECTOMY  2003  . COLON SURGERY  2005   colon blockage due to adhesions  . GALLBLADDER SURGERY  1974  . HERNIA REPAIR  2003   incisional  . TOTAL HIP ARTHROPLASTY Right 11/25/2016   Procedure: RIGHT TOTAL HIP ARTHROPLASTY ANTERIOR APPROACH;  Surgeon: Rod Can, MD;  Location: [facility];  Service: Orthopedics;  Laterality: Right;  NEEDS RNFA  . TUBAL LIGATION  1990  . UMBILICAL HERNIA REPAIR  1997    OB History   No obstetric history on file.       Home Medications    Prior to Admission medications   Medication Sig Start Date End Date Taking? Authorizing Provider  doxycycline (VIBRAMYCIN) 100 MG capsule Take 1 capsule (100 mg total) by mouth 2 (two) times daily. 06/19/20  Yes Sharion Balloon, NP  aspirin 81 MG chewable tablet Chew 1 tablet (81 mg total) by mouth 2 (two) times daily. 11/26/16   Swinteck, Aaron Edelman, MD  Calcium-Vitamin D-Vitamin K (VIACTIV) 941-740-81 MG-UNT-MCG CHEW Chew 1 tablet by mouth 2 (two) times daily.    [provider]  docusate sodium (COLACE) 100 MG capsule Take 1 capsule (100 mg total) by mouth 2 (two) times daily. 11/26/16   Swinteck, Aaron Edelman, MD  HYDROcodone-acetaminophen (NORCO/VICODIN) 5-325 MG tablet Take 1-2 tablets by mouth every 4 (four) hours as needed (breakthrough pain). 11/26/16   Swinteck, Aaron Edelman, MD  levothyroxine (SYNTHROID, LEVOTHROID) 50 MCG tablet Take 50 mcg by mouth daily before breakfast.    [provider]  lisinopril (PRINIVIL,ZESTRIL) 20 MG tablet Take 10 mg by mouth daily with breakfast.    [provider]  metFORMIN (GLUCOPHAGE) 500 MG tablet Take 500 mg by mouth 2 (two) times daily.    [provider]  Omega-3 Fatty Acids (OMEGA 3 PO) Take 75 mLs by mouth daily at 2 PM. With Pomegranate juice    [provider]  omeprazole (PRILOSEC) 20 MG capsule Take 20 mg by mouth daily. 06/13/19   [provider]  ondansetron (ZOFRAN) 4 MG tablet Take 1 tablet (4 mg total) by mouth every 6 (six) hours as needed for nausea. 11/26/16   Swinteck, Aaron Edelman, MD  Polyethyl Glycol-Propyl Glycol (SYSTANE) 0.4-0.3 % SOLN Place 1-2 drops into both eyes 3 (three) times daily as needed (for dry eyes.).    [provider]  ranitidine (ZANTAC) 150 MG tablet Take 150 mg by mouth at bedtime.    [provider]  rosuvastatin (CRESTOR) 10 MG tablet Take 5 mg by mouth daily with supper.    [provider]  senna (SENOKOT) 8.6 MG TABS tablet Take 2  tablets (17.2 mg total) by mouth at bedtime. 11/26/16   Swinteck, Aaron Edelman, MD  sertraline (ZOLOFT) 50 MG tablet Take 50 mg by mouth daily with breakfast.    [provider]  triamcinolone cream (KENALOG) 0.1 % Apply 1 application topically 2 (two) times daily as needed (for ezcema.).     [provider]    Family History Family History  Problem Relation Age of Onset  . Cancer Mother        lung  . Heart attack Mother        age 11  . Stroke Father        "mini strokes"  . Cancer Father        prostate  . Diabetes Maternal Grandmother   . Stroke Maternal Grandmother   . Hypertension Maternal Grandmother   . Heart disease Paternal Grandfather   . Hypertension Brother   . Hyperlipidemia Brother   . Heart attack Son        at age 46    Social History Social History   Tobacco Use  . Smoking status: Former Smoker    Packs/day: 3.00    Years: 26.00    Pack years: 78.00    Quit date: 02/22/1986    Years since quitting: 34.3  . Smokeless tobacco: Never Used  Vaping Use  . Vaping Use: Never used  Substance Use Topics  . Alcohol use: Yes    Comment: rarely  . Drug use: No     Allergies   Tetracyclines & related   Review of Systems Review of Systems  Constitutional: Negative for chills and fever.  HENT: Negative for ear pain and sore throat.   Eyes: Negative for pain and visual disturbance.  Respiratory: Negative for cough and shortness of breath.   Cardiovascular: Negative for chest pain and palpitations.  Gastrointestinal: Negative for abdominal pain, nausea and vomiting.  Musculoskeletal: Negative for arthralgias and back pain.  Skin: Positive for color change, rash and wound.  Neurological: Negative for dizziness, weakness, numbness and headaches.  All other systems reviewed and are negative.    Physical Exam Triage Vital Signs ED Triage Vitals  Enc Vitals Group     BP      Pulse      Resp      Temp      Temp src      SpO2      Weight       Height      Head Circumference      Peak Flow      Pain Score      Pain Loc      Pain Edu?      Excl. in [place]?    No data found.  Updated Vital Signs BP  123/78   Pulse 70   Temp 98.2 F (36.8 C)   Resp 19   SpO2 96%   Visual Acuity Right Eye Distance:   Left Eye Distance:   Bilateral Distance:    Right Eye Near:   Left Eye Near:    Bilateral Near:     Physical Exam Vitals and nursing note reviewed.  Constitutional:      General: She is not in acute distress.    Appearance: She is well-developed. She is not ill-appearing.  HENT:     Head: Normocephalic and atraumatic.     Mouth/Throat:     Mouth: Mucous membranes are moist.  Eyes:     Conjunctiva/sclera: Conjunctivae normal.  Cardiovascular:     Rate and Rhythm: Normal rate and regular rhythm.     Heart sounds: Normal heart sounds.  Pulmonary:     Effort: Pulmonary effort is normal. No respiratory distress.     Breath sounds: Normal breath sounds.  Abdominal:     Palpations: Abdomen is soft.     Tenderness: There is no abdominal tenderness.  Musculoskeletal:        General: Normal range of motion.     Cervical back: Neck supple.  Skin:    General: Skin is warm and dry.     Findings: Erythema and lesion present.     Comments: Left forearm: Central lesion with surrounding dark erythema and then surrounding light erythema.  See pictures for details.  Neurological:     General: No focal deficit present.     Mental Status: She is alert and oriented to person, place, and time.     Gait: Gait normal.  Psychiatric:        Mood and Affect: Mood normal.        Behavior: Behavior normal.          UC Treatments / Results  Labs (all labs ordered are listed, but only abnormal results are displayed) Labs Reviewed  B. BURGDORFI ANTIBODIES  ROCKY MTN SPOTTED FVR ABS PNL(IGG+IGM)  EHRLICHIA ANTIBODY PANEL    EKG   Radiology No results found.  Procedures Procedures (including critical care  time)  Medications Ordered in UC Medications - No data to display  Initial Impression / Assessment and Plan / UC Course  I have reviewed the triage vital signs and the nursing notes.  Pertinent labs & imaging results that were available during my care of the patient were reviewed by me and considered in my medical decision making (see chart for details).   Tick bite of left forearm with cellulitis.  Patient is afebrile and does not have systemic symptoms.  Blood work for Lyme disease, RMSF, ehrlichiosis pending.  Treating with doxycycline x10 days.  Strict ED precautions discussed with patient.  Instructed her to follow-up with her PCP in 1 week for wound recheck.  She agrees to plan of care.   Final Clinical Impressions(s) / UC Diagnoses   Final diagnoses:  Tick bite of left forearm, initial encounter  Cellulitis of left upper extremity     Discharge Instructions     Take the doxycycline as directed.  Your blood work for tickborne illnesses is pending.    Schedule an appointment with your primary care provider for a recheck of your wound in 1 week.    Go to the emergency department right away if you have increased redness, increased swelling, red streaks, fever, or other concerning symptoms.    ED Prescriptions    Medication Sig  Dispense Auth. Provider   doxycycline (VIBRAMYCIN) 100 MG capsule Take 1 capsule (100 mg total) by mouth 2 (two) times daily. 20 capsule Sharion Balloon, NP     PDMP not reviewed this encounter.   Sharion Balloon, NP 06/19/20 1527

## 2020-06-19 NOTE — ED Triage Notes (Signed)
Pt pulled a tick off of her left lower arm x 5 days ago. Patient now has a red, swollen, warm, tender to touch area on her left lower arm. Reports small red spots near it as well.

## 2020-06-23 LAB — LYME DISEASE SEROLOGY W/REFLEX: Lyme Total Antibody EIA: NEGATIVE

## 2020-06-23 LAB — RMSF, IGG, IFA: RMSF, IGG, IFA: <1:64 {titer}

## 2020-06-23 LAB — SPECIMEN STATUS REPORT

## 2020-06-23 LAB — ROCKY MTN SPOTTED FVR ABS PNL(IGG+IGM)
RMSF IgG: POSITIVE — AB
RMSF IgM: 0.26 index (ref 0.00–0.89)

## 2020-06-23 LAB — EHRLICHIA ANTIBODY PANEL
E. Chaffeensis (HME) IgM Titer: NEGATIVE
E.Chaffeensis (HME) IgG: NEGATIVE
HGE IgG Titer: NEGATIVE
HGE IgM Titer: NEGATIVE

## 2020-06-26 DIAGNOSIS — W57XXXD Bitten or stung by nonvenomous insect and other nonvenomous arthropods, subsequent encounter: Secondary | ICD-10-CM | POA: Diagnosis not present

## 2020-06-26 DIAGNOSIS — S50362D Insect bite (nonvenomous) of left elbow, subsequent encounter: Secondary | ICD-10-CM | POA: Diagnosis not present

## 2020-07-22 DIAGNOSIS — E039 Hypothyroidism, unspecified: Secondary | ICD-10-CM | POA: Diagnosis not present

## 2020-07-22 DIAGNOSIS — E7800 Pure hypercholesterolemia, unspecified: Secondary | ICD-10-CM | POA: Diagnosis not present

## 2020-07-22 DIAGNOSIS — I10 Essential (primary) hypertension: Secondary | ICD-10-CM | POA: Diagnosis not present

## 2020-07-22 DIAGNOSIS — E1140 Type 2 diabetes mellitus with diabetic neuropathy, unspecified: Secondary | ICD-10-CM | POA: Diagnosis not present

## 2020-09-10 ENCOUNTER — Other Ambulatory Visit: Payer: Self-pay | Admitting: Internal Medicine

## 2020-09-10 DIAGNOSIS — N6310 Unspecified lump in the right breast, unspecified quadrant: Secondary | ICD-10-CM

## 2020-09-21 DIAGNOSIS — I10 Essential (primary) hypertension: Secondary | ICD-10-CM | POA: Diagnosis not present

## 2020-09-21 DIAGNOSIS — E039 Hypothyroidism, unspecified: Secondary | ICD-10-CM | POA: Diagnosis not present

## 2020-09-21 DIAGNOSIS — E7800 Pure hypercholesterolemia, unspecified: Secondary | ICD-10-CM | POA: Diagnosis not present

## 2020-09-21 DIAGNOSIS — E1140 Type 2 diabetes mellitus with diabetic neuropathy, unspecified: Secondary | ICD-10-CM | POA: Diagnosis not present

## 2020-09-30 ENCOUNTER — Ambulatory Visit
Admission: RE | Admit: 2020-09-30 | Discharge: 2020-09-30 | Disposition: A | Discharge status: Home / Self Care | Payer: Medicare Other | Source: Ambulatory Visit | Attending: Internal Medicine | Admitting: Internal Medicine

## 2020-09-30 ENCOUNTER — Other Ambulatory Visit: Payer: Self-pay

## 2020-09-30 DIAGNOSIS — N641 Fat necrosis of breast: Secondary | ICD-10-CM | POA: Diagnosis not present

## 2020-09-30 DIAGNOSIS — N6310 Unspecified lump in the right breast, unspecified quadrant: Secondary | ICD-10-CM

## 2020-10-22 DIAGNOSIS — E1140 Type 2 diabetes mellitus with diabetic neuropathy, unspecified: Secondary | ICD-10-CM | POA: Diagnosis not present

## 2020-10-22 DIAGNOSIS — E039 Hypothyroidism, unspecified: Secondary | ICD-10-CM | POA: Diagnosis not present

## 2020-10-22 DIAGNOSIS — I10 Essential (primary) hypertension: Secondary | ICD-10-CM | POA: Diagnosis not present

## 2020-10-22 DIAGNOSIS — E7800 Pure hypercholesterolemia, unspecified: Secondary | ICD-10-CM | POA: Diagnosis not present

## 2020-11-19 DIAGNOSIS — E559 Vitamin D deficiency, unspecified: Secondary | ICD-10-CM | POA: Diagnosis not present

## 2020-11-19 DIAGNOSIS — Z0000 Encounter for general adult medical examination without abnormal findings: Secondary | ICD-10-CM | POA: Diagnosis not present

## 2020-11-19 DIAGNOSIS — E041 Nontoxic single thyroid nodule: Secondary | ICD-10-CM | POA: Diagnosis not present

## 2020-11-19 DIAGNOSIS — E119 Type 2 diabetes mellitus without complications: Secondary | ICD-10-CM | POA: Diagnosis not present

## 2020-11-19 DIAGNOSIS — M8580 Other specified disorders of bone density and structure, unspecified site: Secondary | ICD-10-CM | POA: Diagnosis not present

## 2020-11-21 DIAGNOSIS — I10 Essential (primary) hypertension: Secondary | ICD-10-CM | POA: Diagnosis not present

## 2020-11-21 DIAGNOSIS — E039 Hypothyroidism, unspecified: Secondary | ICD-10-CM | POA: Diagnosis not present

## 2020-11-21 DIAGNOSIS — E7800 Pure hypercholesterolemia, unspecified: Secondary | ICD-10-CM | POA: Diagnosis not present

## 2020-11-21 DIAGNOSIS — E1140 Type 2 diabetes mellitus with diabetic neuropathy, unspecified: Secondary | ICD-10-CM | POA: Diagnosis not present

## 2020-12-01 ENCOUNTER — Ambulatory Visit
Admission: RE | Admit: 2020-12-01 | Discharge: 2020-12-01 | Disposition: A | Discharge status: Home / Self Care | Payer: Medicare Other | Source: Ambulatory Visit | Attending: Endocrinology | Admitting: Endocrinology

## 2020-12-01 DIAGNOSIS — E079 Disorder of thyroid, unspecified: Secondary | ICD-10-CM | POA: Diagnosis not present

## 2020-12-01 DIAGNOSIS — E041 Nontoxic single thyroid nodule: Secondary | ICD-10-CM

## 2020-12-09 DIAGNOSIS — E119 Type 2 diabetes mellitus without complications: Secondary | ICD-10-CM | POA: Diagnosis not present

## 2020-12-09 DIAGNOSIS — Z23 Encounter for immunization: Secondary | ICD-10-CM | POA: Diagnosis not present

## 2020-12-09 DIAGNOSIS — M8589 Other specified disorders of bone density and structure, multiple sites: Secondary | ICD-10-CM | POA: Diagnosis not present

## 2020-12-09 DIAGNOSIS — M8580 Other specified disorders of bone density and structure, unspecified site: Secondary | ICD-10-CM | POA: Diagnosis not present

## 2020-12-09 DIAGNOSIS — I10 Essential (primary) hypertension: Secondary | ICD-10-CM | POA: Diagnosis not present

## 2020-12-09 DIAGNOSIS — E041 Nontoxic single thyroid nodule: Secondary | ICD-10-CM | POA: Diagnosis not present

## 2020-12-09 DIAGNOSIS — E039 Hypothyroidism, unspecified: Secondary | ICD-10-CM | POA: Diagnosis not present

## 2020-12-16 DIAGNOSIS — E039 Hypothyroidism, unspecified: Secondary | ICD-10-CM | POA: Diagnosis not present

## 2020-12-16 DIAGNOSIS — I10 Essential (primary) hypertension: Secondary | ICD-10-CM | POA: Diagnosis not present

## 2020-12-16 DIAGNOSIS — K635 Polyp of colon: Secondary | ICD-10-CM | POA: Diagnosis not present

## 2020-12-16 DIAGNOSIS — R7989 Other specified abnormal findings of blood chemistry: Secondary | ICD-10-CM | POA: Diagnosis not present

## 2020-12-16 DIAGNOSIS — I6529 Occlusion and stenosis of unspecified carotid artery: Secondary | ICD-10-CM | POA: Diagnosis not present

## 2020-12-16 DIAGNOSIS — R3121 Asymptomatic microscopic hematuria: Secondary | ICD-10-CM | POA: Diagnosis not present

## 2020-12-16 DIAGNOSIS — Z0000 Encounter for general adult medical examination without abnormal findings: Secondary | ICD-10-CM | POA: Diagnosis not present

## 2020-12-16 DIAGNOSIS — E119 Type 2 diabetes mellitus without complications: Secondary | ICD-10-CM | POA: Diagnosis not present

## 2020-12-16 DIAGNOSIS — M8580 Other specified disorders of bone density and structure, unspecified site: Secondary | ICD-10-CM | POA: Diagnosis not present

## 2020-12-16 DIAGNOSIS — E7800 Pure hypercholesterolemia, unspecified: Secondary | ICD-10-CM | POA: Diagnosis not present

## 2020-12-16 DIAGNOSIS — E1140 Type 2 diabetes mellitus with diabetic neuropathy, unspecified: Secondary | ICD-10-CM | POA: Diagnosis not present

## 2020-12-22 DIAGNOSIS — E1140 Type 2 diabetes mellitus with diabetic neuropathy, unspecified: Secondary | ICD-10-CM | POA: Diagnosis not present

## 2020-12-22 DIAGNOSIS — E039 Hypothyroidism, unspecified: Secondary | ICD-10-CM | POA: Diagnosis not present

## 2020-12-22 DIAGNOSIS — I10 Essential (primary) hypertension: Secondary | ICD-10-CM | POA: Diagnosis not present

## 2020-12-22 DIAGNOSIS — H40053 Ocular hypertension, bilateral: Secondary | ICD-10-CM | POA: Diagnosis not present

## 2020-12-22 DIAGNOSIS — E7800 Pure hypercholesterolemia, unspecified: Secondary | ICD-10-CM | POA: Diagnosis not present

## 2021-01-21 DIAGNOSIS — E1140 Type 2 diabetes mellitus with diabetic neuropathy, unspecified: Secondary | ICD-10-CM | POA: Diagnosis not present

## 2021-01-21 DIAGNOSIS — E7800 Pure hypercholesterolemia, unspecified: Secondary | ICD-10-CM | POA: Diagnosis not present

## 2021-01-21 DIAGNOSIS — I10 Essential (primary) hypertension: Secondary | ICD-10-CM | POA: Diagnosis not present

## 2021-01-21 DIAGNOSIS — E039 Hypothyroidism, unspecified: Secondary | ICD-10-CM | POA: Diagnosis not present

## 2021-02-20 DIAGNOSIS — E1140 Type 2 diabetes mellitus with diabetic neuropathy, unspecified: Secondary | ICD-10-CM | POA: Diagnosis not present

## 2021-02-20 DIAGNOSIS — E039 Hypothyroidism, unspecified: Secondary | ICD-10-CM | POA: Diagnosis not present

## 2021-02-20 DIAGNOSIS — E7800 Pure hypercholesterolemia, unspecified: Secondary | ICD-10-CM | POA: Diagnosis not present

## 2021-02-20 DIAGNOSIS — I10 Essential (primary) hypertension: Secondary | ICD-10-CM | POA: Diagnosis not present

## 2021-04-23 ENCOUNTER — Other Ambulatory Visit: Payer: Self-pay | Admitting: Internal Medicine

## 2021-04-23 DIAGNOSIS — Z1231 Encounter for screening mammogram for malignant neoplasm of breast: Secondary | ICD-10-CM

## 2021-05-05 ENCOUNTER — Ambulatory Visit
Admission: RE | Admit: 2021-05-05 | Discharge: 2021-05-05 | Disposition: A | Discharge status: Home / Self Care | Payer: Medicare Other | Source: Ambulatory Visit | Attending: Internal Medicine | Admitting: Internal Medicine

## 2021-05-05 DIAGNOSIS — Z1231 Encounter for screening mammogram for malignant neoplasm of breast: Secondary | ICD-10-CM | POA: Diagnosis not present

## 2021-05-28 DIAGNOSIS — K5730 Diverticulosis of large intestine without perforation or abscess without bleeding: Secondary | ICD-10-CM | POA: Diagnosis not present

## 2021-05-28 DIAGNOSIS — K649 Unspecified hemorrhoids: Secondary | ICD-10-CM | POA: Diagnosis not present

## 2021-05-28 DIAGNOSIS — Z86010 Personal history of colonic polyps: Secondary | ICD-10-CM | POA: Diagnosis not present

## 2021-06-09 DIAGNOSIS — M8580 Other specified disorders of bone density and structure, unspecified site: Secondary | ICD-10-CM | POA: Diagnosis not present

## 2021-06-09 DIAGNOSIS — E7800 Pure hypercholesterolemia, unspecified: Secondary | ICD-10-CM | POA: Diagnosis not present

## 2021-06-09 DIAGNOSIS — E119 Type 2 diabetes mellitus without complications: Secondary | ICD-10-CM | POA: Diagnosis not present

## 2021-07-13 DIAGNOSIS — E7800 Pure hypercholesterolemia, unspecified: Secondary | ICD-10-CM | POA: Diagnosis not present

## 2021-07-13 DIAGNOSIS — E1140 Type 2 diabetes mellitus with diabetic neuropathy, unspecified: Secondary | ICD-10-CM | POA: Diagnosis not present

## 2021-07-13 DIAGNOSIS — E039 Hypothyroidism, unspecified: Secondary | ICD-10-CM | POA: Diagnosis not present

## 2021-07-13 DIAGNOSIS — E559 Vitamin D deficiency, unspecified: Secondary | ICD-10-CM | POA: Diagnosis not present

## 2021-07-13 DIAGNOSIS — I10 Essential (primary) hypertension: Secondary | ICD-10-CM | POA: Diagnosis not present

## 2021-07-13 DIAGNOSIS — Z7982 Long term (current) use of aspirin: Secondary | ICD-10-CM | POA: Diagnosis not present

## 2021-07-13 DIAGNOSIS — I6529 Occlusion and stenosis of unspecified carotid artery: Secondary | ICD-10-CM | POA: Diagnosis not present

## 2021-12-09 DIAGNOSIS — E559 Vitamin D deficiency, unspecified: Secondary | ICD-10-CM | POA: Diagnosis not present

## 2021-12-09 DIAGNOSIS — E1140 Type 2 diabetes mellitus with diabetic neuropathy, unspecified: Secondary | ICD-10-CM | POA: Diagnosis not present

## 2021-12-09 DIAGNOSIS — E041 Nontoxic single thyroid nodule: Secondary | ICD-10-CM | POA: Diagnosis not present

## 2021-12-09 DIAGNOSIS — I10 Essential (primary) hypertension: Secondary | ICD-10-CM | POA: Diagnosis not present

## 2021-12-09 DIAGNOSIS — E039 Hypothyroidism, unspecified: Secondary | ICD-10-CM | POA: Diagnosis not present

## 2021-12-09 DIAGNOSIS — Z23 Encounter for immunization: Secondary | ICD-10-CM | POA: Diagnosis not present

## 2021-12-24 DIAGNOSIS — H40053 Ocular hypertension, bilateral: Secondary | ICD-10-CM | POA: Diagnosis not present

## 2022-01-06 DIAGNOSIS — D2272 Melanocytic nevi of left lower limb, including hip: Secondary | ICD-10-CM | POA: Diagnosis not present

## 2022-01-06 DIAGNOSIS — L821 Other seborrheic keratosis: Secondary | ICD-10-CM | POA: Diagnosis not present

## 2022-01-06 DIAGNOSIS — D2261 Melanocytic nevi of right upper limb, including shoulder: Secondary | ICD-10-CM | POA: Diagnosis not present

## 2022-01-06 DIAGNOSIS — D2271 Melanocytic nevi of right lower limb, including hip: Secondary | ICD-10-CM | POA: Diagnosis not present

## 2022-01-06 DIAGNOSIS — D0361 Melanoma in situ of right upper limb, including shoulder: Secondary | ICD-10-CM | POA: Diagnosis not present

## 2022-01-06 DIAGNOSIS — D485 Neoplasm of uncertain behavior of skin: Secondary | ICD-10-CM | POA: Diagnosis not present

## 2022-01-06 DIAGNOSIS — D225 Melanocytic nevi of trunk: Secondary | ICD-10-CM | POA: Diagnosis not present

## 2022-01-06 DIAGNOSIS — D2262 Melanocytic nevi of left upper limb, including shoulder: Secondary | ICD-10-CM | POA: Diagnosis not present

## 2022-01-06 DIAGNOSIS — L570 Actinic keratosis: Secondary | ICD-10-CM | POA: Diagnosis not present

## 2022-01-06 DIAGNOSIS — D0339 Melanoma in situ of other parts of face: Secondary | ICD-10-CM | POA: Diagnosis not present

## 2022-01-25 DIAGNOSIS — H2511 Age-related nuclear cataract, right eye: Secondary | ICD-10-CM | POA: Diagnosis not present

## 2022-01-27 ENCOUNTER — Encounter: Payer: Self-pay | Admitting: Ophthalmology

## 2022-02-01 NOTE — Discharge Instructions (Signed)

## 2022-02-02 DIAGNOSIS — D2361 Other benign neoplasm of skin of right upper limb, including shoulder: Secondary | ICD-10-CM | POA: Diagnosis not present

## 2022-02-02 DIAGNOSIS — D0361 Melanoma in situ of right upper limb, including shoulder: Secondary | ICD-10-CM | POA: Diagnosis not present

## 2022-02-03 ENCOUNTER — Encounter: Payer: Self-pay | Admitting: Ophthalmology

## 2022-02-03 ENCOUNTER — Encounter
Admission: RE | Disposition: A | Discharge status: Home / Self Care | Payer: Self-pay | Source: Home / Self Care | Attending: Ophthalmology

## 2022-02-03 ENCOUNTER — Ambulatory Visit
Admission: RE | Admit: 2022-02-03 | Discharge: 2022-02-03 | Disposition: A | Discharge status: Home / Self Care | Payer: Medicare Other | Attending: Ophthalmology | Admitting: Ophthalmology

## 2022-02-03 ENCOUNTER — Ambulatory Visit: Payer: Medicare Other | Admitting: Anesthesiology

## 2022-02-03 ENCOUNTER — Other Ambulatory Visit: Payer: Self-pay

## 2022-02-03 DIAGNOSIS — E119 Type 2 diabetes mellitus without complications: Secondary | ICD-10-CM | POA: Diagnosis not present

## 2022-02-03 DIAGNOSIS — I10 Essential (primary) hypertension: Secondary | ICD-10-CM | POA: Diagnosis not present

## 2022-02-03 DIAGNOSIS — E1136 Type 2 diabetes mellitus with diabetic cataract: Secondary | ICD-10-CM | POA: Insufficient documentation

## 2022-02-03 DIAGNOSIS — J45909 Unspecified asthma, uncomplicated: Secondary | ICD-10-CM | POA: Diagnosis not present

## 2022-02-03 DIAGNOSIS — H2511 Age-related nuclear cataract, right eye: Secondary | ICD-10-CM | POA: Insufficient documentation

## 2022-02-03 DIAGNOSIS — G4730 Sleep apnea, unspecified: Secondary | ICD-10-CM | POA: Insufficient documentation

## 2022-02-03 DIAGNOSIS — Z87891 Personal history of nicotine dependence: Secondary | ICD-10-CM | POA: Diagnosis not present

## 2022-02-03 HISTORY — DX: Unspecified asthma, uncomplicated: J45.909

## 2022-02-03 HISTORY — PX: CATARACT EXTRACTION W/PHACO: SHX586

## 2022-02-03 LAB — GLUCOSE, CAPILLARY: Glucose-Capillary: 114 mg/dL — ABNORMAL HIGH (ref 70–99)

## 2022-02-03 SURGERY — PHACOEMULSIFICATION, CATARACT, WITH IOL INSERTION
Anesthesia: Monitor Anesthesia Care | Site: Eye | Laterality: Right

## 2022-02-03 MED ORDER — SIGHTPATH DOSE#1 BSS IO SOLN
INTRAOCULAR | Status: DC | PRN
  Administered 2022-02-03: 75 mL via OPHTHALMIC

## 2022-02-03 MED ORDER — SIGHTPATH DOSE#1 BSS IO SOLN
INTRAOCULAR | Status: DC | PRN
  Administered 2022-02-03: 2 mL

## 2022-02-03 MED ORDER — TETRACAINE HCL 0.5 % OP SOLN
1.0000 [drp] | OPHTHALMIC | Status: DC | PRN
  Administered 2022-02-03 (×3): 1 [drp] via OPHTHALMIC

## 2022-02-03 MED ORDER — SIGHTPATH DOSE#1 NA HYALUR & NA CHOND-NA HYALUR IO KIT
PACK | INTRAOCULAR | Status: DC | PRN
  Administered 2022-02-03: 1 via OPHTHALMIC

## 2022-02-03 MED ORDER — SIGHTPATH DOSE#1 BSS IO SOLN
INTRAOCULAR | Status: DC | PRN
  Administered 2022-02-03: 15 mL via INTRAOCULAR

## 2022-02-03 MED ORDER — ARMC OPHTHALMIC DILATING DROPS
1.0000 | OPHTHALMIC | Status: DC | PRN
  Administered 2022-02-03 (×3): 1 via OPHTHALMIC

## 2022-02-03 MED ORDER — FENTANYL CITRATE PF 50 MCG/ML IJ SOSY: 25.0000 ug | PREFILLED_SYRINGE | INTRAMUSCULAR | Status: DC | PRN

## 2022-02-03 MED ORDER — ONDANSETRON HCL 4 MG/2ML IJ SOLN: 4.0000 mg | Freq: Once | INTRAMUSCULAR | Status: DC | PRN

## 2022-02-03 MED ORDER — LACTATED RINGERS IV SOLN
INTRAVENOUS | Status: DC
Start: 2022-02-03 — End: 2022-02-03

## 2022-02-03 MED ORDER — BRIMONIDINE TARTRATE-TIMOLOL 0.2-0.5 % OP SOLN
OPHTHALMIC | Status: DC | PRN
  Administered 2022-02-03: 1 [drp] via OPHTHALMIC

## 2022-02-03 MED ORDER — CEFUROXIME OPHTHALMIC INJECTION 1 MG/0.1 ML
INJECTION | OPHTHALMIC | Status: DC | PRN
  Administered 2022-02-03: .1 mL via INTRACAMERAL

## 2022-02-03 MED ORDER — FENTANYL CITRATE (PF) 100 MCG/2ML IJ SOLN
INTRAMUSCULAR | Status: DC | PRN
  Administered 2022-02-03 (×2): 50 ug via INTRAVENOUS

## 2022-02-03 MED ORDER — MIDAZOLAM HCL 2 MG/2ML IJ SOLN
INTRAMUSCULAR | Status: DC | PRN
  Administered 2022-02-03: 2 mg via INTRAVENOUS

## 2022-02-03 SURGICAL SUPPLY — 12 items
CANNULA ANT/CHMB 27G (MISCELLANEOUS) IMPLANT
CANNULA ANT/CHMB 27GA (MISCELLANEOUS) IMPLANT
CATARACT SUITE SIGHTPATH (MISCELLANEOUS) ×1 IMPLANT
FEE CATARACT SUITE SIGHTPATH (MISCELLANEOUS) ×1 IMPLANT
GLOVE SRG 8 PF TXTR STRL LF DI (GLOVE) ×1 IMPLANT
GLOVE SURG ENC TEXT LTX SZ7.5 (GLOVE) ×1 IMPLANT
GLOVE SURG UNDER POLY LF SZ8 (GLOVE) ×1
LENS IOL TECNIS EYHANCE 20.0 (Intraocular Lens) IMPLANT
NDL FILTER BLUNT 18X1 1/2 (NEEDLE) ×1 IMPLANT
NEEDLE FILTER BLUNT 18X1 1/2 (NEEDLE) ×1 IMPLANT
SYR 3ML LL SCALE MARK (SYRINGE) ×1 IMPLANT
WATER STERILE IRR 250ML POUR (IV SOLUTION) ×1 IMPLANT

## 2022-02-03 NOTE — Op Note (Signed)
  LOCATION:  [facility]   PREOPERATIVE DIAGNOSIS:    Nuclear sclerotic cataract right eye. H25.11   POSTOPERATIVE DIAGNOSIS:  Nuclear sclerotic cataract right eye.     PROCEDURE:  Phacoemusification with posterior chamber intraocular lens placement of the right eye   ULTRASOUND TIME: Procedure(s): CATARACT EXTRACTION PHACO AND INTRAOCULAR LENS PLACEMENT (IOC) RIGHT DIABETIC 23.48 02:14.4 (Right)  LENS:   Implant Name Type Inv. Item Serial No. Manufacturer Lot No. LRB No. Used Action  LENS IOL TECNIS EYHANCE 20.0 - [id number] Intraocular Lens LENS IOL TECNIS EYHANCE 20.0 [id number] [employer]  Right 1 Implanted         SURGEON:  Wyonia Hough, MD   ANESTHESIA:  Topical with tetracaine drops and 2% Xylocaine jelly, augmented with 1% preservative-free intracameral lidocaine.    COMPLICATIONS:  None.   DESCRIPTION OF PROCEDURE:  The patient was identified in the holding room and transported to the operating room and placed in the supine position under the operating microscope.  The right eye was identified as the operative eye and it was prepped and draped in the usual sterile ophthalmic fashion.   A 1 millimeter clear-corneal paracentesis was made at the 12:00 position.  0.5 ml of preservative-free 1% lidocaine was injected into the anterior chamber. The anterior chamber was filled with Viscoat viscoelastic.  A 2.4 millimeter keratome was used to make a near-clear corneal incision at the 9:00 position.  A curvilinear capsulorrhexis was made with a cystotome and capsulorrhexis forceps.  Balanced salt solution was used to hydrodissect and hydrodelineate the nucleus.   Phacoemulsification was then used in stop and chop fashion to remove the lens nucleus and epinucleus.  The remaining cortex was then removed using the irrigation and aspiration handpiece. Provisc was then placed into the capsular bag to distend it for lens placement.  A lens was then injected into the  capsular bag.  The remaining viscoelastic was aspirated.   Wounds were hydrated with balanced salt solution.  The anterior chamber was inflated to a physiologic pressure with balanced salt solution.  No wound leaks were noted. Cefuroxime 0.1 ml of a '10mg'$ /ml solution was injected into the anterior chamber for a dose of 1 mg of intracameral antibiotic at the completion of the case.   Timolol and Brimonidine drops were applied to the eye.  The patient was taken to the recovery room in stable condition without complications of anesthesia or surgery.   Paula [name] 02/03/2022, 11:04 AM

## 2022-02-03 NOTE — Anesthesia Postprocedure Evaluation (Signed)
Anesthesia Post Note  Patient: [place]  Procedure(s) Performed: CATARACT EXTRACTION PHACO AND INTRAOCULAR LENS PLACEMENT (IOC) RIGHT DIABETIC 23.48 02:14.4 (Right: Eye)  Patient location during evaluation: PACU Anesthesia Type: MAC Level of consciousness: awake and alert Pain management: pain level controlled Vital Signs Assessment: post-procedure vital signs reviewed and stable Respiratory status: spontaneous breathing, nonlabored ventilation, respiratory function stable and patient connected to nasal cannula oxygen Cardiovascular status: stable and blood pressure returned to baseline Postop Assessment: no apparent nausea or vomiting Anesthetic complications: no   There were no known notable events for this encounter.   Last Vitals:  Vitals:   02/03/22 1110 02/03/22 1111  BP: (!) 101/53   Pulse: 64 61  Resp: 11 (!) 9  Temp:    SpO2: 95% 93%    Last Pain:  Vitals:   02/03/22 1107  TempSrc:   PainSc: 0-No pain                 Molli Barrows

## 2022-02-03 NOTE — Anesthesia Preprocedure Evaluation (Signed)
Anesthesia Evaluation  Patient identified by MRN, date of birth, ID band Patient awake    Reviewed: Allergy & Precautions, H&P , NPO status , Patient's Chart, lab work & pertinent test results, reviewed documented beta blocker date and time   Airway Mallampati: II  TM Distance: >3 FB Neck ROM: full    Dental no notable dental hx. (+) Teeth Intact   Pulmonary asthma , sleep apnea , former smoker   Pulmonary exam normal breath sounds clear to auscultation       Cardiovascular Exercise Tolerance: Poor hypertension, On Medications negative cardio ROS  Rhythm:regular Rate:Normal     Neuro/Psych  Neuromuscular disease  negative psych ROS   GI/Hepatic Neg liver ROS, hiatal hernia,GERD  Medicated,,  Endo/Other  negative endocrine ROSdiabetes    Renal/GU      Musculoskeletal   Abdominal   Peds  Hematology negative hematology ROS (+)   Anesthesia Other Findings   Reproductive/Obstetrics negative OB ROS                             Anesthesia Physical Anesthesia Plan  ASA: 3  Anesthesia Plan: MAC   Post-op Pain Management:    Induction:   PONV Risk Score and Plan:   Airway Management Planned:   Additional Equipment:   Intra-op Plan:   Post-operative Plan:   Informed Consent: I have reviewed the patients History and Physical, chart, labs and discussed the procedure including the risks, benefits and alternatives for the proposed anesthesia with the patient or authorized representative who has indicated his/her understanding and acceptance.       Plan Discussed with: CRNA  Anesthesia Plan Comments:        Anesthesia Quick Evaluation

## 2022-02-03 NOTE — H&P (Signed)
[facility]   Primary Care Physician:  Deland Pretty, MD Ophthalmologist: Dr. Leandrew Koyanagi  Pre-Procedure History & Physical: HPI:  Paula [name] is a 71 y.o. female here for ophthalmic surgery.   Past Medical History:  Diagnosis Date   Aortic valve insufficiency    Asthma    Diabetes ([place])    type II   Family history of heart disease    mother, son, and grandparent   GERD (gastroesophageal reflux disease)    Hiatal hernia    History of small bowel obstruction    HTN (hypertension)    Hyperlipidemia    Osteoarthritis    Palpitations    Paresthesias    left-sided   Thyroid nodule    Varicose vein     Past Surgical History:  Procedure Laterality Date   ABDOMINAL HYSTERECTOMY  02/22/2001   BREAST BIOPSY Right 04/07/2020   benign   COLON SURGERY  02/23/2003   colon blockage due to adhesions   GALLBLADDER SURGERY  02/23/1972   HERNIA REPAIR  02/22/2001   incisional   TOTAL HIP ARTHROPLASTY Right 11/25/2016   Procedure: RIGHT TOTAL HIP ARTHROPLASTY ANTERIOR APPROACH;  Surgeon: Rod Can, MD;  Location: [facility];  Service: Orthopedics;  Laterality: Right;  NEEDS RNFA   TUBAL LIGATION  76/28/3151   UMBILICAL HERNIA REPAIR  02/23/1995    Prior to Admission medications   Medication Sig Start Date End Date Taking? Authorizing Provider  aspirin 81 MG chewable tablet Chew 1 tablet (81 mg total) by mouth 2 (two) times daily. 11/26/16  Yes Swinteck, Aaron Edelman, MD  Calcium-Vitamin D-Vitamin K (VIACTIV) [id number] MG-UNT-MCG CHEW Chew 1 tablet by mouth 2 (two) times daily.   Yes [provider]  cyanocobalamin (VITAMIN B12) 500 MCG tablet Take 5,000 mcg by mouth daily.   Yes [provider]  levothyroxine (SYNTHROID, LEVOTHROID) 50 MCG tablet Take 50 mcg by mouth daily before breakfast.   Yes [provider]  lisinopril (PRINIVIL,ZESTRIL) 20 MG tablet Take 10 mg by mouth daily with breakfast.   Yes [provider]   metFORMIN (GLUCOPHAGE) 500 MG tablet Take 500 mg by mouth 2 (two) times daily.   Yes [provider]  Polyethyl Glycol-Propyl Glycol (SYSTANE) 0.4-0.3 % SOLN Place 1-2 drops into both eyes 3 (three) times daily as needed (for dry eyes.).   Yes [provider]  rosuvastatin (CRESTOR) 10 MG tablet Take 5 mg by mouth daily with supper.   Yes [provider]  sertraline (ZOLOFT) 50 MG tablet Take 50 mg by mouth daily with breakfast.   Yes [provider]  triamcinolone cream (KENALOG) 0.1 % Apply 1 application topically 2 (two) times daily as needed (for ezcema.).    Yes [provider]  docusate sodium (COLACE) 100 MG capsule Take 1 capsule (100 mg total) by mouth 2 (two) times daily. Patient not taking: Reported on 01/27/2022 11/26/16   Rod Can, MD  doxycycline (VIBRAMYCIN) 100 MG capsule Take 1 capsule (100 mg total) by mouth 2 (two) times daily. Patient not taking: Reported on 01/27/2022 06/19/20   Sharion Balloon, NP  HYDROcodone-acetaminophen (NORCO/VICODIN) 5-325 MG tablet Take 1-2 tablets by mouth every 4 (four) hours as needed (breakthrough pain). Patient not taking: Reported on 01/27/2022 11/26/16   Rod Can, MD  Omega-3 Fatty Acids (OMEGA 3 PO) Take 75 mLs by mouth daily at 2 PM. With Pomegranate juice Patient not taking: Reported on 01/27/2022    [provider]  omeprazole (PRILOSEC) 20 MG  capsule Take 20 mg by mouth daily. Patient not taking: Reported on 01/27/2022 06/13/19   [provider]  ondansetron (ZOFRAN) 4 MG tablet Take 1 tablet (4 mg total) by mouth every 6 (six) hours as needed for nausea. Patient not taking: Reported on 01/27/2022 11/26/16   Rod Can, MD  ranitidine (ZANTAC) 150 MG tablet Take 150 mg by mouth at bedtime. Patient not taking: Reported on 01/27/2022    [provider]  senna (SENOKOT) 8.6 MG TABS tablet Take 2 tablets (17.2 mg total) by mouth at bedtime. Patient not taking:  Reported on 01/27/2022 11/26/16   Rod Can, MD    Allergies as of 12/29/2021 - Review Complete 06/19/2020  Allergen Reaction Noted   Tetracyclines & related Nausea Only 08/01/2013    Family History  Problem Relation Age of Onset   Cancer Mother        lung   Heart attack Mother        age 78   Stroke Father        "mini strokes"   Cancer Father        prostate   Diabetes Maternal Grandmother    Stroke Maternal Grandmother    Hypertension Maternal Grandmother    Heart disease Paternal Grandfather    Hypertension Brother    Hyperlipidemia Brother    Heart attack Son        at age 49    Social History   Socioeconomic History   Marital status: Married    Spouse name: Paula [name]   Number of children: 3   Years of education: 12   Highest education level: Not on file  Occupational History   Occupation: retired    Comment: office job  Tobacco Use   Smoking status: Former    Packs/day: 3.00    Years: 26.00    Total pack years: 78.00    Types: Cigarettes    Quit date: 02/22/1986    Years since quitting: 35.9   Smokeless tobacco: Never  Vaping Use   Vaping Use: Never used  Substance and Sexual Activity   Alcohol use: Yes    Comment: rarely   Drug use: No   Sexual activity: Not on file  Other Topics Concern   Not on file  Social History Narrative   Lives at home with husband   Caffeine use- 1-2 cups coffee   Social Determinants of Health   Financial Resource Strain: Not on file  Food Insecurity: Not on file  Transportation Needs: Not on file  Physical Activity: Not on file  Stress: Not on file  Social Connections: Not on file  Intimate Partner Violence: Not on file    Review of Systems: See HPI, otherwise negative ROS  Physical Exam: BP 124/62   Pulse 61   Temp (!) 97.3 F (36.3 C) (Temporal)   Ht '5\' 3"'$  (1.6 m)   Wt 80.2 kg   SpO2 98%   BMI 31.34 kg/m  General:   Alert,  pleasant and cooperative in NAD Head:  Normocephalic and  atraumatic. Lungs:  Clear to auscultation.    Heart:  Regular rate and rhythm.   Impression/Plan: Paula [name] is here for ophthalmic surgery.  Risks, benefits, limitations, and alternatives regarding ophthalmic surgery have been reviewed with the patient.  Questions have been answered.  All parties agreeable.   Leandrew Koyanagi, MD  02/03/2022, 10:15 AM

## 2022-02-03 NOTE — Transfer of Care (Signed)
Immediate Anesthesia Transfer of Care Note  Patient: Paula [name]  Procedure(s) Performed: CATARACT EXTRACTION PHACO AND INTRAOCULAR LENS PLACEMENT (IOC) RIGHT DIABETIC 23.48 02:14.4 (Right: Eye)  Patient Location: PACU  Anesthesia Type: MAC  Level of Consciousness: awake, alert  and patient cooperative  Airway and Oxygen Therapy: Patient Spontanous Breathing and Patient connected to supplemental oxygen  Post-op Assessment: Post-op Vital signs reviewed, Patient's Cardiovascular Status Stable, Respiratory Function Stable, Patent Airway and No signs of Nausea or vomiting  Post-op Vital Signs: Reviewed and stable  Complications: There were no known notable events for this encounter.

## 2022-02-04 ENCOUNTER — Encounter: Payer: Self-pay | Admitting: Ophthalmology

## 2022-02-08 ENCOUNTER — Encounter: Payer: Self-pay | Admitting: Ophthalmology

## 2022-02-08 DIAGNOSIS — H2512 Age-related nuclear cataract, left eye: Secondary | ICD-10-CM | POA: Diagnosis not present

## 2022-02-09 DIAGNOSIS — L814 Other melanin hyperpigmentation: Secondary | ICD-10-CM | POA: Diagnosis not present

## 2022-02-09 DIAGNOSIS — L988 Other specified disorders of the skin and subcutaneous tissue: Secondary | ICD-10-CM | POA: Diagnosis not present

## 2022-02-09 DIAGNOSIS — D0339 Melanoma in situ of other parts of face: Secondary | ICD-10-CM | POA: Diagnosis not present

## 2022-02-09 DIAGNOSIS — L578 Other skin changes due to chronic exposure to nonionizing radiation: Secondary | ICD-10-CM | POA: Diagnosis not present

## 2022-02-11 ENCOUNTER — Ambulatory Visit (LOCAL_COMMUNITY_HEALTH_CENTER): Payer: Medicare Other

## 2022-02-11 DIAGNOSIS — Z23 Encounter for immunization: Secondary | ICD-10-CM

## 2022-02-11 DIAGNOSIS — Z7185 Encounter for immunization safety counseling: Secondary | ICD-10-CM

## 2022-02-11 NOTE — Discharge Instructions (Signed)

## 2022-02-11 NOTE — Progress Notes (Signed)
  Are you feeling sick today? No   Have you ever received a dose of COVID-19 Vaccine? [employer], [place], [place], [place], Other) Yes  If yes, which vaccine and how many doses?   Pfizer and 5 doses   Did you bring the vaccination record card or other documentation?  Yes   Do you have a health condition or are undergoing treatment that makes you moderately or severely immunocompromised? This would include, but not be limited to: cancer, HIV, organ transplant, immunosuppressive therapy/high-dose corticosteroids, or moderate/severe primary immunodeficiency.  No-  Recent dx of melanoma (skin) stage 0 / had surgery to remove  Have you received COVID-19 vaccine before or during hematopoietic cell transplant (HCT) or CAR-T-cell therapies? No  Have you ever had an allergic reaction to: (This would include a severe allergic reaction or a reaction that caused hives, swelling, or respiratory distress, including wheezing.) A component of a COVID-19 vaccine or a previous dose of COVID-19 vaccine? No   Have you ever had an allergic reaction to another vaccine (other thanCOVID-19 vaccine) or an injectable medication? (This would include a severe allergic reaction or a reaction that caused hives, swelling, or respiratory distress, including wheezing.)   No    Do you have a history of any of the following:  Myocarditis or Pericarditis No  Dermal fillers:  No  Multisystem Inflammatory Syndrome (MIS-C or MIS-A)? No  COVID-19 disease within the past 3 months? No  Vaccinated with monkeypox vaccine in the last 4 weeks? No  Pfizer Comirnaty [id number] (12 yrs +) given and tolerated well. Updated [facility] copy and covid card given. Stayed for 15 min observation without problem. Josie Saunders, RN

## 2022-03-04 DIAGNOSIS — E559 Vitamin D deficiency, unspecified: Secondary | ICD-10-CM | POA: Diagnosis not present

## 2022-03-04 DIAGNOSIS — Z0000 Encounter for general adult medical examination without abnormal findings: Secondary | ICD-10-CM | POA: Diagnosis not present

## 2022-03-04 DIAGNOSIS — E063 Autoimmune thyroiditis: Secondary | ICD-10-CM | POA: Diagnosis not present

## 2022-03-04 DIAGNOSIS — E7800 Pure hypercholesterolemia, unspecified: Secondary | ICD-10-CM | POA: Diagnosis not present

## 2022-03-04 DIAGNOSIS — I10 Essential (primary) hypertension: Secondary | ICD-10-CM | POA: Diagnosis not present

## 2022-03-10 DIAGNOSIS — R3129 Other microscopic hematuria: Secondary | ICD-10-CM | POA: Diagnosis not present

## 2022-03-10 DIAGNOSIS — E039 Hypothyroidism, unspecified: Secondary | ICD-10-CM | POA: Diagnosis not present

## 2022-03-10 DIAGNOSIS — I10 Essential (primary) hypertension: Secondary | ICD-10-CM | POA: Diagnosis not present

## 2022-03-10 DIAGNOSIS — E1140 Type 2 diabetes mellitus with diabetic neuropathy, unspecified: Secondary | ICD-10-CM | POA: Diagnosis not present

## 2022-03-10 DIAGNOSIS — Z0000 Encounter for general adult medical examination without abnormal findings: Secondary | ICD-10-CM | POA: Diagnosis not present

## 2022-03-10 DIAGNOSIS — E559 Vitamin D deficiency, unspecified: Secondary | ICD-10-CM | POA: Diagnosis not present

## 2022-03-10 DIAGNOSIS — E7800 Pure hypercholesterolemia, unspecified: Secondary | ICD-10-CM | POA: Diagnosis not present

## 2022-03-12 ENCOUNTER — Other Ambulatory Visit: Payer: Self-pay | Admitting: Registered Nurse

## 2022-03-12 DIAGNOSIS — E7800 Pure hypercholesterolemia, unspecified: Secondary | ICD-10-CM

## 2022-03-24 ENCOUNTER — Encounter: Payer: Self-pay | Admitting: Ophthalmology

## 2022-03-31 ENCOUNTER — Other Ambulatory Visit: Payer: Self-pay

## 2022-03-31 ENCOUNTER — Encounter: Payer: Self-pay | Admitting: Ophthalmology

## 2022-03-31 ENCOUNTER — Ambulatory Visit
Admission: RE | Admit: 2022-03-31 | Discharge: 2022-03-31 | Disposition: A | Discharge status: Home / Self Care | Payer: Medicare Other | Attending: Ophthalmology | Admitting: Ophthalmology

## 2022-03-31 ENCOUNTER — Ambulatory Visit: Payer: Medicare Other | Admitting: Anesthesiology

## 2022-03-31 ENCOUNTER — Encounter
Admission: RE | Disposition: A | Discharge status: Home / Self Care | Payer: Self-pay | Source: Home / Self Care | Attending: Ophthalmology

## 2022-03-31 DIAGNOSIS — I10 Essential (primary) hypertension: Secondary | ICD-10-CM | POA: Diagnosis not present

## 2022-03-31 DIAGNOSIS — E785 Hyperlipidemia, unspecified: Secondary | ICD-10-CM | POA: Diagnosis not present

## 2022-03-31 DIAGNOSIS — K219 Gastro-esophageal reflux disease without esophagitis: Secondary | ICD-10-CM | POA: Diagnosis not present

## 2022-03-31 DIAGNOSIS — Z7984 Long term (current) use of oral hypoglycemic drugs: Secondary | ICD-10-CM | POA: Diagnosis not present

## 2022-03-31 DIAGNOSIS — M1990 Unspecified osteoarthritis, unspecified site: Secondary | ICD-10-CM | POA: Insufficient documentation

## 2022-03-31 DIAGNOSIS — Z87891 Personal history of nicotine dependence: Secondary | ICD-10-CM | POA: Insufficient documentation

## 2022-03-31 DIAGNOSIS — Z833 Family history of diabetes mellitus: Secondary | ICD-10-CM | POA: Insufficient documentation

## 2022-03-31 DIAGNOSIS — G4730 Sleep apnea, unspecified: Secondary | ICD-10-CM | POA: Diagnosis not present

## 2022-03-31 DIAGNOSIS — J45909 Unspecified asthma, uncomplicated: Secondary | ICD-10-CM | POA: Insufficient documentation

## 2022-03-31 DIAGNOSIS — H2512 Age-related nuclear cataract, left eye: Secondary | ICD-10-CM | POA: Diagnosis not present

## 2022-03-31 DIAGNOSIS — E119 Type 2 diabetes mellitus without complications: Secondary | ICD-10-CM | POA: Diagnosis not present

## 2022-03-31 DIAGNOSIS — E1136 Type 2 diabetes mellitus with diabetic cataract: Secondary | ICD-10-CM | POA: Diagnosis not present

## 2022-03-31 HISTORY — PX: CATARACT EXTRACTION W/PHACO: SHX586

## 2022-03-31 LAB — GLUCOSE, CAPILLARY: Glucose-Capillary: 101 mg/dL — ABNORMAL HIGH (ref 70–99)

## 2022-03-31 SURGERY — PHACOEMULSIFICATION, CATARACT, WITH IOL INSERTION
Anesthesia: Monitor Anesthesia Care | Site: Eye | Laterality: Left

## 2022-03-31 MED ORDER — TETRACAINE HCL 0.5 % OP SOLN
1.0000 [drp] | OPHTHALMIC | Status: DC | PRN
  Administered 2022-03-31 (×3): 1 [drp] via OPHTHALMIC

## 2022-03-31 MED ORDER — MIDAZOLAM HCL 2 MG/2ML IJ SOLN
INTRAMUSCULAR | Status: DC | PRN
  Administered 2022-03-31: 1.5 mg via INTRAVENOUS

## 2022-03-31 MED ORDER — SIGHTPATH DOSE#1 NA HYALUR & NA CHOND-NA HYALUR IO KIT
PACK | INTRAOCULAR | Status: DC | PRN
  Administered 2022-03-31: 1 via OPHTHALMIC

## 2022-03-31 MED ORDER — BRIMONIDINE TARTRATE-TIMOLOL 0.2-0.5 % OP SOLN
OPHTHALMIC | Status: DC | PRN
  Administered 2022-03-31: 1 [drp] via OPHTHALMIC

## 2022-03-31 MED ORDER — SODIUM CHLORIDE 0.9% FLUSH
INTRAVENOUS | Status: DC | PRN
  Administered 2022-03-31: 10 mL via INTRAVENOUS

## 2022-03-31 MED ORDER — SIGHTPATH DOSE#1 BSS IO SOLN
INTRAOCULAR | Status: DC | PRN
  Administered 2022-03-31: 15 mL via INTRAOCULAR

## 2022-03-31 MED ORDER — SIGHTPATH DOSE#1 BSS IO SOLN
INTRAOCULAR | Status: DC | PRN
  Administered 2022-03-31: 68 mL via OPHTHALMIC

## 2022-03-31 MED ORDER — SIGHTPATH DOSE#1 BSS IO SOLN
INTRAOCULAR | Status: DC | PRN
  Administered 2022-03-31: 2 mL

## 2022-03-31 MED ORDER — FENTANYL CITRATE (PF) 100 MCG/2ML IJ SOLN
INTRAMUSCULAR | Status: DC | PRN
  Administered 2022-03-31: 25 ug via INTRAVENOUS

## 2022-03-31 MED ORDER — CEFUROXIME OPHTHALMIC INJECTION 1 MG/0.1 ML
INJECTION | OPHTHALMIC | Status: DC | PRN
  Administered 2022-03-31: .1 mL via INTRACAMERAL

## 2022-03-31 MED ORDER — ARMC OPHTHALMIC DILATING DROPS
1.0000 | OPHTHALMIC | Status: DC | PRN
  Administered 2022-03-31 (×3): 1 via OPHTHALMIC

## 2022-03-31 SURGICAL SUPPLY — 12 items
CANNULA ANT/CHMB 27G (MISCELLANEOUS) IMPLANT
CANNULA ANT/CHMB 27GA (MISCELLANEOUS) IMPLANT
CATARACT SUITE SIGHTPATH (MISCELLANEOUS) ×1 IMPLANT
FEE CATARACT SUITE SIGHTPATH (MISCELLANEOUS) ×1 IMPLANT
GLOVE SRG 8 PF TXTR STRL LF DI (GLOVE) ×1 IMPLANT
GLOVE SURG ENC TEXT LTX SZ7.5 (GLOVE) ×1 IMPLANT
GLOVE SURG UNDER POLY LF SZ8 (GLOVE) ×1
LENS IOL TECNIS EYHANCE 19.0 (Intraocular Lens) IMPLANT
NDL FILTER BLUNT 18X1 1/2 (NEEDLE) ×1 IMPLANT
NEEDLE FILTER BLUNT 18X1 1/2 (NEEDLE) ×1 IMPLANT
SYR 3ML LL SCALE MARK (SYRINGE) ×1 IMPLANT
WATER STERILE IRR 250ML POUR (IV SOLUTION) ×1 IMPLANT

## 2022-03-31 NOTE — Op Note (Signed)
OPERATIVE NOTE  Janelly Switalski [id number] 03/31/2022   PREOPERATIVE DIAGNOSIS:  Nuclear sclerotic cataract left eye. H25.12   POSTOPERATIVE DIAGNOSIS:    Nuclear sclerotic cataract left eye.     PROCEDURE:  Phacoemusification with posterior chamber intraocular lens placement of the left eye  Ultrasound time: Procedure(s) with comments: CATARACT EXTRACTION PHACO AND INTRAOCULAR LENS PLACEMENT (IOC) LEFT DIABETIC 13.16 01:04.0 (Left) - Diabetic  LENS:   Implant Name Type Inv. Item Serial No. Manufacturer Lot No. LRB No. Used Action  LENS IOL TECNIS EYHANCE 19.0 - [id number] Intraocular Lens LENS IOL TECNIS EYHANCE 19.0 [id number] [employer]  Left 1 Implanted      SURGEON:  Wyonia Hough, MD   ANESTHESIA:  Topical with tetracaine drops and 2% Xylocaine jelly, augmented with 1% preservative-free intracameral lidocaine.    COMPLICATIONS:  None.   DESCRIPTION OF PROCEDURE:  The patient was identified in the holding room and transported to the operating room and placed in the supine position under the operating microscope.  The left eye was identified as the operative eye and it was prepped and draped in the usual sterile ophthalmic fashion.   A 1 millimeter clear-corneal paracentesis was made at the 1:30 position.  0.5 ml of preservative-free 1% lidocaine was injected into the anterior chamber.  The anterior chamber was filled with Viscoat viscoelastic.  A 2.4 millimeter keratome was used to make a near-clear corneal incision at the 10:30 position.  .  A curvilinear capsulorrhexis was made with a cystotome and capsulorrhexis forceps.  Balanced salt solution was used to hydrodissect and hydrodelineate the nucleus.   Phacoemulsification was then used in stop and chop fashion to remove the lens nucleus and epinucleus.  The remaining cortex was then removed using the irrigation and aspiration handpiece. Provisc was then placed into the capsular bag to distend it for lens placement.   A lens was then injected into the capsular bag.  The remaining viscoelastic was aspirated.   Wounds were hydrated with balanced salt solution.  The anterior chamber was inflated to a physiologic pressure with balanced salt solution.  No wound leaks were noted. Cefuroxime 0.1 ml of a '10mg'$ /ml solution was injected into the anterior chamber for a dose of 1 mg of intracameral antibiotic at the completion of the case.   Timolol and Brimonidine drops were applied to the eye.  The patient was taken to the recovery room in stable condition without complications of anesthesia or surgery.  Cariana Karge 03/31/2022, 10:16 AM

## 2022-03-31 NOTE — H&P (Signed)
[facility]   Primary Care Physician:  Deland Pretty, MD Ophthalmologist: Dr. Leandrew Koyanagi  Pre-Procedure History & Physical: HPI:  Paula [name] is a 72 y.o. female here for ophthalmic surgery.   Past Medical History:  Diagnosis Date   Aortic valve insufficiency    Asthma    Diabetes ([place])    type II   Family history of heart disease    mother, son, and grandparent   GERD (gastroesophageal reflux disease)    Hiatal hernia    History of small bowel obstruction    HTN (hypertension)    Hyperlipidemia    Osteoarthritis    Palpitations    Paresthesias    left-sided   Thyroid nodule    Varicose vein     Past Surgical History:  Procedure Laterality Date   ABDOMINAL HYSTERECTOMY  02/22/2001   BREAST BIOPSY Right 04/07/2020   benign   CATARACT EXTRACTION W/PHACO Right 02/03/2022   Procedure: CATARACT EXTRACTION PHACO AND INTRAOCULAR LENS PLACEMENT ([place]) RIGHT DIABETIC 23.48 02:14.4;  Surgeon: Leandrew Koyanagi, MD;  Location: [place];  Service: Ophthalmology;  Laterality: Right;   COLON SURGERY  02/23/2003   colon blockage due to adhesions   GALLBLADDER SURGERY  02/23/1972   HERNIA REPAIR  02/22/2001   incisional   TOTAL HIP ARTHROPLASTY Right 11/25/2016   Procedure: RIGHT TOTAL HIP ARTHROPLASTY ANTERIOR APPROACH;  Surgeon: Rod Can, MD;  Location: [facility];  Service: Orthopedics;  Laterality: Right;  NEEDS RNFA   TUBAL LIGATION  29/93/7169   UMBILICAL HERNIA REPAIR  02/23/1995    Prior to Admission medications   Medication Sig Start Date End Date Taking? Authorizing Provider  aspirin 81 MG chewable tablet Chew 1 tablet (81 mg total) by mouth 2 (two) times daily. 11/26/16  Yes Swinteck, Aaron Edelman, MD  Calcium-Vitamin D-Vitamin K (VIACTIV) 678-938-10 MG-UNT-MCG CHEW Chew 1 tablet by mouth 2 (two) times daily.   Yes [provider]  cyanocobalamin (VITAMIN B12) 500 MCG tablet Take 5,000 mcg by mouth daily.   Yes [provider]  Lactobacillus-Inulin (CULTURELLE DIGESTIVE DAILY PRO PO) Take 2 tablets by mouth daily.   Yes [provider]  levothyroxine (SYNTHROID, LEVOTHROID) 50 MCG tablet Take 50 mcg by mouth daily before breakfast.   Yes [provider]  lisinopril (PRINIVIL,ZESTRIL) 20 MG tablet Take 10 mg by mouth daily with breakfast.   Yes [provider]  metFORMIN (GLUCOPHAGE) 500 MG tablet Take 500 mg by mouth 2 (two) times daily.   Yes [provider]  rosuvastatin (CRESTOR) 10 MG tablet Take 5 mg by mouth daily with supper.   Yes [provider]  sertraline (ZOLOFT) 50 MG tablet Take 50 mg by mouth daily with breakfast.   Yes [provider]  triamcinolone cream (KENALOG) 0.1 % Apply 1 application topically 2 (two) times daily as needed (for ezcema.).    Yes [provider]  Polyethyl Glycol-Propyl Glycol (SYSTANE) 0.4-0.3 % SOLN Place 1-2 drops into both eyes 3 (three) times daily as needed (for dry eyes.).    [provider]    Allergies as of 12/29/2021 - Review Complete 06/19/2020  Allergen Reaction Noted   Tetracyclines & related Nausea Only 08/01/2013    Family History  Problem Relation Age of Onset   Cancer Mother        lung   Heart attack Mother        age 75   Stroke Father        "mini  strokes"   Cancer Father        prostate   Diabetes Maternal Grandmother    Stroke Maternal Grandmother    Hypertension Maternal Grandmother    Heart disease Paternal Grandfather    Hypertension Brother    Hyperlipidemia Brother    Heart attack Son        at age 20    Social History   Socioeconomic History   Marital status: Married    Spouse name: Paula [name]   Number of children: 3   Years of education: 12   Highest education level: Not on file  Occupational History   Occupation: retired    Comment: office job  Tobacco Use   Smoking status: Former    Packs/day: 3.00    Years: 26.00    Total pack years:  78.00    Types: Cigarettes    Quit date: 02/22/1986    Years since quitting: 36.1   Smokeless tobacco: Never  Vaping Use   Vaping Use: Never used  Substance and Sexual Activity   Alcohol use: Yes    Comment: rarely   Drug use: No   Sexual activity: Not on file  Other Topics Concern   Not on file  Social History Narrative   Lives at home with husband   Caffeine use- 1-2 cups coffee   Social Determinants of Health   Financial Resource Strain: Not on file  Food Insecurity: Not on file  Transportation Needs: Not on file  Physical Activity: Not on file  Stress: Not on file  Social Connections: Not on file  Intimate Partner Violence: Not on file    Review of Systems: See HPI, otherwise negative ROS  Physical Exam: BP (!) 125/55   Pulse 66   Temp (!) 97.4 F (36.3 C) (Temporal)   Resp 16   Ht '5\' 2"'$  (1.575 m)   Wt 81.6 kg   SpO2 98%   BMI 32.92 kg/m  General:   Alert,  pleasant and cooperative in NAD Head:  Normocephalic and atraumatic. Lungs:  Clear to auscultation.    Heart:  Regular rate and rhythm.   Impression/Plan: Paula [name] is here for ophthalmic surgery.  Risks, benefits, limitations, and alternatives regarding ophthalmic surgery have been reviewed with the patient.  Questions have been answered.  All parties agreeable.   Leandrew Koyanagi, MD  03/31/2022, 9:38 AM

## 2022-03-31 NOTE — Anesthesia Postprocedure Evaluation (Signed)
Anesthesia Post Note  Patient: [place]  Procedure(s) Performed: CATARACT EXTRACTION PHACO AND INTRAOCULAR LENS PLACEMENT (IOC) LEFT DIABETIC 13.16 01:04.0 (Left: Eye)  Patient location during evaluation: PACU Anesthesia Type: MAC Level of consciousness: awake and alert Pain management: pain level controlled Vital Signs Assessment: post-procedure vital signs reviewed and stable Respiratory status: spontaneous breathing, nonlabored ventilation, respiratory function stable and patient connected to nasal cannula oxygen Cardiovascular status: stable and blood pressure returned to baseline Postop Assessment: no apparent nausea or vomiting Anesthetic complications: no   No notable events documented.   Last Vitals:  Vitals:   03/31/22 1017 03/31/22 1021  BP: (!) 104/51 (!) 97/53  Pulse: 81 61  Resp: 14 16  Temp: (!) 36.4 C (!) 36.4 C  SpO2: 100% 99%    Last Pain:  Vitals:   03/31/22 1021  TempSrc:   PainSc: 0-No pain                 Ilene Qua

## 2022-03-31 NOTE — Transfer of Care (Signed)
Immediate Anesthesia Transfer of Care Note  Patient: Paula [name]  Procedure(s) Performed: CATARACT EXTRACTION PHACO AND INTRAOCULAR LENS PLACEMENT (IOC) LEFT DIABETIC 13.16 01:04.0 (Left: Eye)  Patient Location: PACU  Anesthesia Type:MAC  Level of Consciousness: awake, alert , and oriented  Airway & Oxygen Therapy: Patient Spontanous Breathing  Post-op Assessment: Report given to RN  Post vital signs: Reviewed and stable  Last Vitals:  Vitals Value Taken Time  BP 104/51 03/31/22 1018  Temp 36.4 C 03/31/22 1017  Pulse 60 03/31/22 1019  Resp 15 03/31/22 1019  SpO2 100 % 03/31/22 1019  Vitals shown include unvalidated device data.  Last Pain:  Vitals:   03/31/22 1017  TempSrc:   PainSc: 0-No pain      Patients Stated Pain Goal: 0 (37/04/88 8916)  Complications: No notable events documented.

## 2022-03-31 NOTE — Anesthesia Preprocedure Evaluation (Signed)
Anesthesia Evaluation  Patient identified by MRN, date of birth, ID band Patient awake    Reviewed: Allergy & Precautions, H&P , NPO status , Patient's Chart, lab work & pertinent test results, reviewed documented beta blocker date and time   Airway Mallampati: II  TM Distance: >3 FB Neck ROM: full    Dental no notable dental hx. (+) Teeth Intact   Pulmonary asthma , sleep apnea , former smoker   Pulmonary exam normal breath sounds clear to auscultation       Cardiovascular Exercise Tolerance: Poor hypertension, On Medications negative cardio ROS  Rhythm:regular Rate:Normal     Neuro/Psych  Neuromuscular disease  negative psych ROS   GI/Hepatic Neg liver ROS, hiatal hernia,GERD  Medicated,,  Endo/Other  negative endocrine ROSdiabetes    Renal/GU      Musculoskeletal   Abdominal   Peds  Hematology negative hematology ROS (+)   Anesthesia Other Findings   Reproductive/Obstetrics negative OB ROS                              Anesthesia Physical Anesthesia Plan  ASA: 3  Anesthesia Plan: MAC   Post-op Pain Management:    Induction: Intravenous  PONV Risk Score and Plan:   Airway Management Planned: Natural Airway and Nasal Cannula  Additional Equipment:   Intra-op Plan:   Post-operative Plan:   Informed Consent: I have reviewed the patients History and Physical, chart, labs and discussed the procedure including the risks, benefits and alternatives for the proposed anesthesia with the patient or authorized representative who has indicated his/her understanding and acceptance.     Dental Advisory Given  Plan Discussed with: CRNA  Anesthesia Plan Comments: (Patient consented for risks of anesthesia including but not limited to:  - adverse reactions to medications - damage to eyes, teeth, lips or other oral mucosa - nerve damage due to positioning  - sore throat or  hoarseness - Damage to heart, brain, nerves, lungs, other parts of body or loss of life  Patient voiced understanding.)        Anesthesia Quick Evaluation

## 2022-04-01 ENCOUNTER — Encounter: Payer: Self-pay | Admitting: Ophthalmology

## 2022-04-14 DIAGNOSIS — D1801 Hemangioma of skin and subcutaneous tissue: Secondary | ICD-10-CM | POA: Diagnosis not present

## 2022-04-14 DIAGNOSIS — L821 Other seborrheic keratosis: Secondary | ICD-10-CM | POA: Diagnosis not present

## 2022-04-22 DIAGNOSIS — Z961 Presence of intraocular lens: Secondary | ICD-10-CM | POA: Diagnosis not present

## 2022-06-11 ENCOUNTER — Ambulatory Visit
Admission: RE | Admit: 2022-06-11 | Discharge: 2022-06-11 | Disposition: A | Discharge status: Home / Self Care | Payer: Medicare Other | Source: Ambulatory Visit | Attending: Registered Nurse | Admitting: Registered Nurse

## 2022-06-11 DIAGNOSIS — E7800 Pure hypercholesterolemia, unspecified: Secondary | ICD-10-CM

## 2022-06-14 DIAGNOSIS — E041 Nontoxic single thyroid nodule: Secondary | ICD-10-CM | POA: Diagnosis not present

## 2022-06-14 DIAGNOSIS — E7800 Pure hypercholesterolemia, unspecified: Secondary | ICD-10-CM | POA: Diagnosis not present

## 2022-06-14 DIAGNOSIS — E039 Hypothyroidism, unspecified: Secondary | ICD-10-CM | POA: Diagnosis not present

## 2022-06-14 DIAGNOSIS — E119 Type 2 diabetes mellitus without complications: Secondary | ICD-10-CM | POA: Diagnosis not present

## 2022-06-14 DIAGNOSIS — I10 Essential (primary) hypertension: Secondary | ICD-10-CM | POA: Diagnosis not present

## 2022-06-15 ENCOUNTER — Other Ambulatory Visit: Payer: Self-pay | Admitting: Endocrinology

## 2022-06-15 DIAGNOSIS — E041 Nontoxic single thyroid nodule: Secondary | ICD-10-CM

## 2022-06-16 ENCOUNTER — Ambulatory Visit
Admission: RE | Admit: 2022-06-16 | Discharge: 2022-06-16 | Disposition: A | Discharge status: Home / Self Care | Payer: Medicare Other | Source: Ambulatory Visit | Attending: Endocrinology | Admitting: Endocrinology

## 2022-06-16 DIAGNOSIS — E041 Nontoxic single thyroid nodule: Secondary | ICD-10-CM | POA: Insufficient documentation

## 2022-06-16 DIAGNOSIS — E042 Nontoxic multinodular goiter: Secondary | ICD-10-CM | POA: Diagnosis not present

## 2022-07-09 ENCOUNTER — Ambulatory Visit
Admission: RE | Admit: 2022-07-09 | Discharge: 2022-07-09 | Disposition: A | Discharge status: Home / Self Care | Payer: Medicare Other | Source: Ambulatory Visit | Attending: Emergency Medicine | Admitting: Emergency Medicine

## 2022-07-09 VITALS — BP 108/69 | HR 76 | Temp 98.8°F | Resp 18

## 2022-07-09 DIAGNOSIS — I10 Essential (primary) hypertension: Secondary | ICD-10-CM | POA: Insufficient documentation

## 2022-07-09 DIAGNOSIS — W57XXXA Bitten or stung by nonvenomous insect and other nonvenomous arthropods, initial encounter: Secondary | ICD-10-CM | POA: Insufficient documentation

## 2022-07-09 DIAGNOSIS — B349 Viral infection, unspecified: Secondary | ICD-10-CM | POA: Insufficient documentation

## 2022-07-09 DIAGNOSIS — Z1152 Encounter for screening for COVID-19: Secondary | ICD-10-CM | POA: Insufficient documentation

## 2022-07-09 DIAGNOSIS — E119 Type 2 diabetes mellitus without complications: Secondary | ICD-10-CM | POA: Insufficient documentation

## 2022-07-09 DIAGNOSIS — Z87891 Personal history of nicotine dependence: Secondary | ICD-10-CM | POA: Diagnosis not present

## 2022-07-09 DIAGNOSIS — J45909 Unspecified asthma, uncomplicated: Secondary | ICD-10-CM | POA: Insufficient documentation

## 2022-07-09 DIAGNOSIS — S80862A Insect bite (nonvenomous), left lower leg, initial encounter: Secondary | ICD-10-CM | POA: Diagnosis not present

## 2022-07-09 NOTE — Discharge Instructions (Addendum)
Your COVID test is pending.    Take Tylenol as needed for fever or discomfort.  Rest and keep yourself hydrated.    Follow-up with your primary care provider if your symptoms are not improving.     

## 2022-07-09 NOTE — ED Triage Notes (Signed)
After triage, pt wanted to make a note that she had two tick bites approx two weeks ago.  Did not have rash, does not have joint aches or fevers.

## 2022-07-09 NOTE — ED Triage Notes (Signed)
Pt presents with cough, HA, laryngitis x 3 days. Has been taking Ibuprofen and OTC cough syrup (SafeTussin). Primarily here to r/o COVID d/t immunocompromised husband.

## 2022-07-09 NOTE — ED Provider Notes (Signed)
Paula [name]    CSN: [id number] Arrival date & time: 07/09/22  1553      History   Chief Complaint Chief Complaint  Patient presents with   Cough    HPI Paula [name] is a 72 y.o. female.  Patient presents with 3 day history of headache, hoarse voice, mild sore throat, cough.  She also reports two tick bites on her left leg 2 weeks ago.  She denies fever, chills, rash, chest pain, shortness of breath, or other symptoms.  She has been treating her symptoms with ibuprofen and cough medicine.  She requests a COVID test.  Her medical history includes hypertension, diabetes, asthma.  The history is provided by the patient and medical records.    Past Medical History:  Diagnosis Date   Aortic valve insufficiency    Asthma    Diabetes ([facility])    type II   Family history of heart disease    mother, son, and grandparent   GERD (gastroesophageal reflux disease)    Hiatal hernia    History of small bowel obstruction    HTN (hypertension)    Hyperlipidemia    Osteoarthritis    Palpitations    Paresthesias    left-sided   Thyroid nodule    Varicose vein     Patient Active Problem List   Diagnosis Date Noted   Osteoarthritis of right hip 11/25/2016   Essential hypertension 08/01/2013   Hyperlipidemia 08/01/2013   Diabetes ([facility]) 08/01/2013   Sleep apnea 08/01/2013   Morbid obesity ([facility]) 08/01/2013   Palpitations 08/01/2013   Paresthesias 08/01/2013    Past Surgical History:  Procedure Laterality Date   ABDOMINAL HYSTERECTOMY  02/22/2001   BREAST BIOPSY Right 04/07/2020   benign   CATARACT EXTRACTION W/PHACO Right 02/03/2022   Procedure: CATARACT EXTRACTION PHACO AND INTRAOCULAR LENS PLACEMENT ([facility]) RIGHT DIABETIC 23.48 02:14.4;  Surgeon: Lockie Mola, MD;  Location: [facility];  Service: Ophthalmology;  Laterality: Right;   CATARACT EXTRACTION W/PHACO Left 03/31/2022   Procedure: CATARACT EXTRACTION PHACO AND INTRAOCULAR LENS PLACEMENT  (IOC) LEFT DIABETIC 13.16 01:04.0;  Surgeon: Lockie Mola, MD;  Location: [facility];  Service: Ophthalmology;  Laterality: Left;  Diabetic   COLON SURGERY  02/23/2003   colon blockage due to adhesions   GALLBLADDER SURGERY  02/23/1972   HERNIA REPAIR  02/22/2001   incisional   TOTAL HIP ARTHROPLASTY Right 11/25/2016   Procedure: RIGHT TOTAL HIP ARTHROPLASTY ANTERIOR APPROACH;  Surgeon: Samson Frederic, MD;  Location: [facility];  Service: Orthopedics;  Laterality: Right;  NEEDS RNFA   TUBAL LIGATION  02/23/1988   UMBILICAL HERNIA REPAIR  02/23/1995    OB History   No obstetric history on file.      Home Medications    Prior to Admission medications   Medication Sig Start Date End Date Taking? Authorizing Provider  aspirin 81 MG chewable tablet Chew 1 tablet (81 mg total) by mouth 2 (two) times daily. 11/26/16  Yes Swinteck, Arlys John, MD  Calcium-Vitamin D-Vitamin K (VIACTIV) 500-500-40 MG-UNT-MCG CHEW Chew 1 tablet by mouth 2 (two) times daily.   Yes [provider]  Cholecalciferol (VITAMIN D-3 PO) Take by mouth.   Yes [provider]  cyanocobalamin (VITAMIN B12) 500 MCG tablet Take 5,000 mcg by mouth daily.   Yes [provider]  Lactobacillus-Inulin (CULTURELLE DIGESTIVE DAILY PRO PO) Take 2 tablets by mouth daily.   Yes [provider]  levothyroxine (SYNTHROID, LEVOTHROID) 50 MCG tablet Take 50 mcg by  mouth daily before breakfast.   Yes [provider]  lisinopril (PRINIVIL,ZESTRIL) 20 MG tablet Take 10 mg by mouth daily with breakfast.   Yes [provider]  metFORMIN (GLUCOPHAGE) 500 MG tablet Take 500 mg by mouth 2 (two) times daily.   Yes [provider]  Polyethyl Glycol-Propyl Glycol (SYSTANE) 0.4-0.3 % SOLN Place 1-2 drops into both eyes 3 (three) times daily as needed (for dry eyes.).   Yes [provider]  rosuvastatin (CRESTOR) 10 MG tablet Take 5 mg by mouth daily with supper.   Yes  [provider]  sertraline (ZOLOFT) 50 MG tablet Take 50 mg by mouth daily with breakfast.   Yes [provider]  triamcinolone cream (KENALOG) 0.1 % Apply 1 application topically 2 (two) times daily as needed (for ezcema.).     [provider]    Family History Family History  Problem Relation Age of Onset   Cancer Mother        lung   Heart attack Mother        age 84   Stroke Father        "mini strokes"   Cancer Father        prostate   Diabetes Maternal Grandmother    Stroke Maternal Grandmother    Hypertension Maternal Grandmother    Heart disease Paternal Grandfather    Hypertension Brother    Hyperlipidemia Brother    Heart attack Son        at age 19    Social History Social History   Tobacco Use   Smoking status: Former    Packs/day: 3.00    Years: 26.00    Additional pack years: 0.00    Total pack years: 78.00    Types: Cigarettes    Quit date: 02/22/1986    Years since quitting: 36.4   Smokeless tobacco: Never  Vaping Use   Vaping Use: Never used  Substance Use Topics   Alcohol use: Yes    Comment: rarely   Drug use: No     Allergies   Tetracyclines & related   Review of Systems Review of Systems  Constitutional:  Negative for chills and fever.  HENT:  Positive for sore throat and voice change. Negative for ear pain.   Respiratory:  Positive for cough. Negative for shortness of breath.   Cardiovascular:  Negative for chest pain and palpitations.  Gastrointestinal:  Negative for diarrhea, nausea and vomiting.  Skin:  Negative for rash.  Neurological:  Positive for headaches.     Physical Exam Triage Vital Signs ED Triage Vitals  Enc Vitals Group     BP      Pulse      Resp      Temp      Temp src      SpO2      Weight      Height      Head Circumference      Peak Flow      Pain Score      Pain Loc      Pain Edu?      Excl. in GC?    No data found.  Updated Vital Signs BP 108/69 (BP Location:  Left Arm)   Pulse 76   Temp 98.8 F (37.1 C)   Resp 18   SpO2 95%   Visual Acuity Right Eye Distance:   Left Eye Distance:   Bilateral Distance:    Right Eye Near:  Left Eye Near:    Bilateral Near:     Physical Exam Vitals and nursing note reviewed.  Constitutional:      General: She is not in acute distress.    Appearance: She is well-developed. She is obese. She is not ill-appearing.  HENT:     Right Ear: Tympanic membrane normal.     Left Ear: Tympanic membrane normal.     Nose: Nose normal.     Mouth/Throat:     Mouth: Mucous membranes are moist.     Pharynx: Oropharynx is clear.  Cardiovascular:     Rate and Rhythm: Normal rate and regular rhythm.     Heart sounds: Normal heart sounds.  Pulmonary:     Effort: Pulmonary effort is normal. No respiratory distress.     Breath sounds: Normal breath sounds.  Musculoskeletal:     Cervical back: Neck supple.  Skin:    General: Skin is warm and dry.     Findings: Lesion present. No erythema or rash.     Comments: Healing tick bites on left leg. No erythema.   Neurological:     Mental Status: She is alert.  Psychiatric:        Mood and Affect: Mood normal.        Behavior: Behavior normal.      UC Treatments / Results  Labs (all labs ordered are listed, but only abnormal results are displayed) Labs Reviewed  SARS CORONAVIRUS 2 (TAT 6-24 HRS)    EKG   Radiology No results found.  Procedures Procedures (including critical care time)  Medications Ordered in UC Medications - No data to display  Initial Impression / Assessment and Plan / UC Course  I have reviewed the triage vital signs and the nursing notes.  Pertinent labs & imaging results that were available during my care of the patient were reviewed by me and considered in my medical decision making (see chart for details).    Viral illness, tick bite of left lower leg.  Patient does not have fever or rash.  Provided on tick related illness.   Instructed patient to return right away if she notes any concerning symptoms.  Per her request, COVID pending.  Discussed symptomatic treatment including Tylenol, rest, hydration.  If COVID positive, recommend treatment with molnupiravir.  Instructed patient to follow up with her PCP if symptoms are not improving.  She agrees to plan of care.   Final Clinical Impressions(s) / UC Diagnoses   Final diagnoses:  Viral illness  Tick bite of left lower leg, initial encounter     Discharge Instructions      Your COVID test is pending.    Take Tylenol as needed for fever or discomfort.  Rest and keep yourself hydrated.    Follow-up with your primary care provider if your symptoms are not improving.         ED Prescriptions   None    PDMP not reviewed this encounter.   Mickie Bail, NP 07/09/22 [phone]

## 2022-07-10 LAB — SARS CORONAVIRUS 2 (TAT 6-24 HRS): SARS Coronavirus 2: NEGATIVE

## 2022-08-05 ENCOUNTER — Encounter: Payer: Self-pay | Admitting: Registered Nurse

## 2022-09-10 DIAGNOSIS — E7800 Pure hypercholesterolemia, unspecified: Secondary | ICD-10-CM | POA: Diagnosis not present

## 2022-09-15 DIAGNOSIS — E7800 Pure hypercholesterolemia, unspecified: Secondary | ICD-10-CM | POA: Diagnosis not present

## 2022-09-15 DIAGNOSIS — E559 Vitamin D deficiency, unspecified: Secondary | ICD-10-CM | POA: Diagnosis not present

## 2022-09-15 DIAGNOSIS — E1140 Type 2 diabetes mellitus with diabetic neuropathy, unspecified: Secondary | ICD-10-CM | POA: Diagnosis not present

## 2022-09-15 DIAGNOSIS — I10 Essential (primary) hypertension: Secondary | ICD-10-CM | POA: Diagnosis not present

## 2022-09-15 DIAGNOSIS — I2510 Atherosclerotic heart disease of native coronary artery without angina pectoris: Secondary | ICD-10-CM | POA: Diagnosis not present

## 2022-09-15 DIAGNOSIS — R509 Fever, unspecified: Secondary | ICD-10-CM | POA: Diagnosis not present

## 2022-09-15 DIAGNOSIS — E039 Hypothyroidism, unspecified: Secondary | ICD-10-CM | POA: Diagnosis not present

## 2022-11-02 DIAGNOSIS — H40003 Preglaucoma, unspecified, bilateral: Secondary | ICD-10-CM | POA: Diagnosis not present

## 2022-11-02 DIAGNOSIS — H26491 Other secondary cataract, right eye: Secondary | ICD-10-CM | POA: Diagnosis not present

## 2022-11-02 DIAGNOSIS — E119 Type 2 diabetes mellitus without complications: Secondary | ICD-10-CM | POA: Diagnosis not present

## 2022-11-22 DIAGNOSIS — D2261 Melanocytic nevi of right upper limb, including shoulder: Secondary | ICD-10-CM | POA: Diagnosis not present

## 2022-11-22 DIAGNOSIS — D225 Melanocytic nevi of trunk: Secondary | ICD-10-CM | POA: Diagnosis not present

## 2022-11-22 DIAGNOSIS — D2262 Melanocytic nevi of left upper limb, including shoulder: Secondary | ICD-10-CM | POA: Diagnosis not present

## 2022-11-22 DIAGNOSIS — L570 Actinic keratosis: Secondary | ICD-10-CM | POA: Diagnosis not present

## 2022-11-22 DIAGNOSIS — D2272 Melanocytic nevi of left lower limb, including hip: Secondary | ICD-10-CM | POA: Diagnosis not present

## 2022-11-22 DIAGNOSIS — L821 Other seborrheic keratosis: Secondary | ICD-10-CM | POA: Diagnosis not present

## 2022-11-22 DIAGNOSIS — Z85820 Personal history of malignant melanoma of skin: Secondary | ICD-10-CM | POA: Diagnosis not present

## 2022-11-22 DIAGNOSIS — D2271 Melanocytic nevi of right lower limb, including hip: Secondary | ICD-10-CM | POA: Diagnosis not present

## 2022-12-08 ENCOUNTER — Telehealth: Payer: Self-pay | Admitting: *Deleted

## 2022-12-08 ENCOUNTER — Encounter: Payer: Self-pay | Admitting: *Deleted

## 2022-12-08 NOTE — Telephone Encounter (Signed)
Lmovm to verify card hx.

## 2022-12-09 DIAGNOSIS — I2510 Atherosclerotic heart disease of native coronary artery without angina pectoris: Secondary | ICD-10-CM | POA: Insufficient documentation

## 2022-12-09 NOTE — Progress Notes (Signed)
Cardiology Office Note  Date:  12/10/2022   ID:  Paula [name], Paula [name] August 15, 1950, MRN [id number]  PCP:  Paula Brunette, MD   Chief Complaint  Patient presents with   New Patient (Initial Visit)    Patient c/o lightheaded with changing positions, chest tightness/indigestion, racing heart beats at times and has shortness of breath with over exertion. Medications reviewed by the patient verbally.     HPI:  [name] is a 72 year old woman with past medical history of Sleep apnea Diabetes Obesity Hyperlipidemia Former smoker, quit 1988, smoked age 41 to (24 years) Who presents by referral from Paula [name] for cardiovascular risk evaluation  On discussion today, she reports recent CT calcium scoring April 2024  Coronary calcium score of 248. This was 79th percentile for age and sex matched control. Images pulled up and reviewed, calcification predominantly in the RCA home Very mild aortic atherosclerosis  In the past several days, has developed Knee pain on left, uses a cane Prior hip surgery on right  She does report some shortness of breath on exertion which she attributes to deconditioning Denies significant chest pain concerning for angina  Couple times a month will develop symptomatic tachycardia, palpitations Resoves without intervention Happens without warning  Lab work reviewed A1c 6.2 Total chol 137, LDL 58  EKG personally reviewed by myself on todays visit EKG Interpretation Date/Time:  Friday December 10 2022 10:55:31 EDT Ventricular Rate:  71 PR Interval:  126 QRS Duration:  76 QT Interval:  378 QTC Calculation: 410 R Axis:   37  Text Interpretation: Normal sinus rhythm Normal ECG When compared with ECG of 26-Dec-2008 18:54, No significant change was found Confirmed by Paula [name] [phone]) on 12/10/2022 1:04:13 PM    PMH:   has a past medical history of Aortic valve insufficiency, Asthma, Diabetes (HCC), Family history of heart disease, GERD  (gastroesophageal reflux disease), Hiatal hernia, History of small bowel obstruction, HTN (hypertension), Hyperlipidemia, Osteoarthritis, Palpitations, Paresthesias, Thyroid nodule, and Varicose vein.  PSH:    Past Surgical History:  Procedure Laterality Date   ABDOMINAL HYSTERECTOMY  02/22/2001   BREAST BIOPSY Right 04/07/2020   benign   CATARACT EXTRACTION W/PHACO Right 02/03/2022   Procedure: CATARACT EXTRACTION PHACO AND INTRAOCULAR LENS PLACEMENT (IOC) RIGHT DIABETIC 23.48 02:14.4;  Surgeon: Lockie Mola, MD;  Location: [facility];  Service: Ophthalmology;  Laterality: Right;   CATARACT EXTRACTION W/PHACO Left 03/31/2022   Procedure: CATARACT EXTRACTION PHACO AND INTRAOCULAR LENS PLACEMENT (IOC) LEFT DIABETIC 13.16 01:04.0;  Surgeon: Lockie Mola, MD;  Location: [facility];  Service: Ophthalmology;  Laterality: Left;  Diabetic   COLON SURGERY  02/23/2003   colon blockage due to adhesions   GALLBLADDER SURGERY  02/23/1972   HERNIA REPAIR  02/22/2001   incisional   TOTAL HIP ARTHROPLASTY Right 11/25/2016   Procedure: RIGHT TOTAL HIP ARTHROPLASTY ANTERIOR APPROACH;  Surgeon: Samson Frederic, MD;  Location: [facility];  Service: Orthopedics;  Laterality: Right;  NEEDS RNFA   TUBAL LIGATION  02/23/1988   UMBILICAL HERNIA REPAIR  02/23/1995    Current Outpatient Medications  Medication Sig Dispense Refill   aspirin 81 MG chewable tablet Chew 1 tablet (81 mg total) by mouth 2 (two) times daily. 60 tablet 1   Calcium-Vitamin D-Vitamin K (VIACTIV) 500-500-40 MG-UNT-MCG CHEW Chew 1 tablet by mouth 2 (two) times daily.     Cholecalciferol (VITAMIN D-3 PO) Take 4,000 Units by mouth daily.     cyanocobalamin (VITAMIN B12) 500 MCG tablet Take 5,000 mcg  by mouth daily.     Lactobacillus-Inulin (CULTURELLE DIGESTIVE DAILY PRO PO) Take 2 tablets by mouth daily.     levothyroxine (SYNTHROID, LEVOTHROID) 50 MCG tablet Take 50 mcg by mouth daily before breakfast.      lisinopril (PRINIVIL,ZESTRIL) 20 MG tablet Take 10 mg by mouth daily with breakfast.     metFORMIN (GLUCOPHAGE) 500 MG tablet Take 500 mg by mouth 2 (two) times daily.     Polyethyl Glycol-Propyl Glycol (SYSTANE) 0.4-0.3 % SOLN Place 1-2 drops into both eyes 3 (three) times daily as needed (for dry eyes.).     rosuvastatin (CRESTOR) 10 MG tablet Take 5 mg by mouth daily with supper.     sertraline (ZOLOFT) 50 MG tablet Take 50 mg by mouth daily with breakfast.     triamcinolone cream (KENALOG) 0.1 % Apply 1 application topically 2 (two) times daily as needed (for ezcema.).      No current facility-administered medications for this visit.     Allergies:   Tetracycline and Tetracyclines & related   Social History:  The patient  reports that she quit smoking about 36 years ago. Her smoking use included cigarettes. She started smoking about 62 years ago. She has a 78 pack-year smoking history. She has never used smokeless tobacco. She reports current alcohol use. She reports that she does not use drugs.   Family History:   family history includes Cancer in her father and mother; Diabetes in her maternal grandmother; Heart attack in her mother and son; Heart disease in her paternal grandfather; Hyperlipidemia in her brother; Hypertension in her brother and maternal grandmother; Stroke in her father and maternal grandmother.    Review of Systems: Review of Systems  Constitutional: Negative.   HENT: Negative.    Respiratory: Negative.    Cardiovascular: Negative.   Gastrointestinal: Negative.   Musculoskeletal: Negative.   Neurological: Negative.   Psychiatric/Behavioral: Negative.    All other systems reviewed and are negative.    PHYSICAL EXAM: VS:  BP 124/66 (BP Location: Right Arm, Patient Position: Sitting, Cuff Size: Normal)   Pulse 71   Ht 5' 2.5" (1.588 m)   Wt 179 lb 8 oz (81.4 kg)   SpO2 97%   BMI 32.31 kg/m  , BMI Body mass index is 32.31 kg/m. GEN: Well nourished, well  developed, in no acute distress HEENT: normal Neck: no JVD, carotid bruits, or masses Cardiac: RRR; no murmurs, rubs, or gallops,no edema  Respiratory:  clear to auscultation bilaterally, normal work of breathing GI: soft, nontender, nondistended, + BS MS: no deformity or atrophy Skin: warm and dry, no rash Neuro:  Strength and sensation are intact Psych: euthymic mood, full affect  Recent Labs: No results found for requested labs within last 365 days.    Lipid Panel No results found for: "CHOL", "HDL", "LDLCALC", "TRIG"    Wt Readings from Last 3 Encounters:  12/10/22 179 lb 8 oz (81.4 kg)  03/31/22 180 lb (81.6 kg)  02/03/22 176 lb 14.4 oz (80.2 kg)       ASSESSMENT AND PLAN:  Problem List Items Addressed This Visit       Cardiology Problems   Coronary artery calcification - Primary   Relevant Orders   EKG 12-Lead (Completed)   Essential hypertension   Relevant Orders   EKG 12-Lead (Completed)   Hyperlipidemia     Other   Diabetes (HCC)   Sleep apnea   Coronary calcification Images pulled up and reviewed in detail, mild calcification in  the RCA, minimal in LAD and left circumflex Former smoker, diabetes numbers well-controlled, cholesterol well-controlled Denies anginal symptoms at this time Discussed for anginal symptoms, she call our office.  Additional workup with stress testing with Myoview or cardiac CTA could be ordered  Hyperlipidemia Cholesterol is at goal on the current lipid regimen. No changes to the medications were made.  Essential hypertension Blood pressure is well controlled on today's visit. No changes made to the medications.  Diabetes type 2 We have encouraged continued exercise, careful diet management in an effort to lose weight. A1c well-controlled  Paroxysmal tachycardia/palpitations Etiology unclear, Zio monitor ordered Prefers not to start beta-blocker at this time  Former smoker Likely contributing to mild shortness of  breath on exertion Reports that she stopped in 1988   Signed, Dossie Arbour, M.D., Ph.D. [facility] [place], [place] [phone]

## 2022-12-10 ENCOUNTER — Ambulatory Visit: Payer: Medicare Other

## 2022-12-10 ENCOUNTER — Encounter: Payer: Self-pay | Admitting: Cardiovascular Disease

## 2022-12-10 ENCOUNTER — Ambulatory Visit: Payer: Medicare Other | Attending: Cardiovascular Disease | Admitting: Cardiovascular Disease

## 2022-12-10 VITALS — BP 124/66 | HR 71 | Ht 62.5 in | Wt 179.5 lb

## 2022-12-10 DIAGNOSIS — I10 Essential (primary) hypertension: Secondary | ICD-10-CM | POA: Diagnosis not present

## 2022-12-10 DIAGNOSIS — E782 Mixed hyperlipidemia: Secondary | ICD-10-CM | POA: Diagnosis not present

## 2022-12-10 DIAGNOSIS — I2510 Atherosclerotic heart disease of native coronary artery without angina pectoris: Secondary | ICD-10-CM | POA: Diagnosis not present

## 2022-12-10 DIAGNOSIS — G4730 Sleep apnea, unspecified: Secondary | ICD-10-CM | POA: Diagnosis not present

## 2022-12-10 DIAGNOSIS — R002 Palpitations: Secondary | ICD-10-CM

## 2022-12-10 DIAGNOSIS — I479 Paroxysmal tachycardia, unspecified: Secondary | ICD-10-CM | POA: Diagnosis not present

## 2022-12-10 DIAGNOSIS — E1159 Type 2 diabetes mellitus with other circulatory complications: Secondary | ICD-10-CM | POA: Diagnosis not present

## 2022-12-10 NOTE — Patient Instructions (Addendum)
Medication Instructions:  No changes  If you need a refill on your cardiac medications before your next appointment, please call your pharmacy.   Lab work: No new labs needed  Testing/Procedures: Heart Monitor:  Your physician has requested you wear a ZIO monitor for 14 days.  Your monitor will be mailed to your home address within 3-5 business days. This is sent via [employer] from [employer]. However, if you have not received your monitor after 5 business days please send [place] a MyChart message or call the office at [phone], so we may follow up on this for you.   This monitor is a medical device (single patch monitor) that records the heart's electrical activity. Doctors most often use these monitors to diagnose arrhythmias. Arrhythmias are problems with the speed or rhythm of the heartbeat.   [employer] supplies 1 patch per enrollment. Additional stickers are not available.  Please DO NOT apply the patch if you will be having a Nuclear Stress Test, Echocardiogram, Cardiac CT, Cardiac MRI, Chest X-ray during the period you would be wearing the monitor. The patch cannot be worn during these tests.  You cannot remove and re-apply the ZIO patch monitor.   Applying the Monitor: Once you receive your monitor, this will include a small razor, abrader, and 4 alcohol pads. Shave hair from upper left chest Rub abrader disc in 40 strokes over the left upper chest as indicated in your monitor instructions Clean area with 4 enclosed alcohol pads (there may be a mild & brief stinging sensation over the newly abraded area, but this is normal). Let dry Apply patch as indicated in monitor instructions. Patch will be placed under collarbone on the left side of the chest with arrow pointing upward. Rub adhesive wings for 2 minutes. Remove white label marked "1". Remove the white label marked "2". Rub patch adhesive wings for an 2 minutes.  While looking in a mirror, press and release button in  the center of the patch. You may hear a "click". A small green light will flash 4-6 times and then stop. This will be your indicator that the monitor has been turned on.  Wearing the Monitor: Avoid showering during the first 24 hours of wearing the monitor.  After 24 hours you may shower with the patch on. Take brief showers with your back facing the shower head.  Avoid excessive sweating to help maximize wear time. Do not submerge the device, no hot tubs, and no swimming pools. Keep any lotions or oils away from the patch. Press the button if you feel a symptom. You will hear a small click. Record date, time, and symptoms in the Patient Logbook or App.  Monitor Issues: Call [employer] Customer Care at [phone] if you have questions regarding your Zio Patch Monitor. Call them immediately if you see an orange/ amber colored light blinking on your monitor. If your monitor falls off and you cannot get this reapplied or if you need suggestions for securing your monitor call [employer] at [phone].   Returning the Monitor: Once you have completed wearing your monitor, follow instructions on the last 2 pages of the Patient Logbook. Stick monitor patch on to the last page of the Patient Logbook.  Place Patient Logbook with monitor in the return box provided. Use locking tab on box and tape box closed securely. The return box has pre-paid postage on it.  Place the return box in the regular [place] Mail box as soon as possible It will  take anywhere from 1-2 weeks for your provider to receive and review your results once you mail this back. If for some reason you have misplaced your return box then call our office and we can provide another box and/or mail it off for you.   Billing  and Patient Assistance Program Information: We have supplied [employer] with any of your insurance information on file for billing purposes. [employer] offers a sliding scale Patient Assistance Program for patients  that do not have insurance, or whose insurance does not completely cover the cost of the ZIO monitor. You must apply for the Patient Assistance Program to qualify for this discounted rate. To apply, please call [employer] at [phone], select option 1, ask to apply for the Patient Assistance Program. [employer] will ask your household income, and how many people are in your household. They will quote your out-of-pocket cost based on that information. [employer] will also be able to set up for a 8-month, interest-free payment plan if needed.     Follow-Up: At [facility], you and your health needs are our priority.  As part of our continuing mission to provide you with exceptional heart care, we have created designated Provider Care Teams.  These Care Teams include your primary Cardiologist (physician) and Advanced Practice Providers (APPs -  Physician Assistants and Nurse Practitioners) who all work together to provide you with the care you need, when you need it.  You will need a follow up appointment in 12 months  Providers on your designated Care Team:   Nicolasa Ducking, NP Eula Listen, PA-C Cadence Fransico Michael, [place]  COVID-19 Vaccine Information can be found at: [email or web address] For questions related to vaccine distribution or appointments, please email [email or web address] or call [phone].

## 2022-12-21 ENCOUNTER — Other Ambulatory Visit: Payer: Self-pay | Admitting: Internal Medicine

## 2022-12-21 DIAGNOSIS — Z1231 Encounter for screening mammogram for malignant neoplasm of breast: Secondary | ICD-10-CM

## 2023-01-25 DIAGNOSIS — I479 Paroxysmal tachycardia, unspecified: Secondary | ICD-10-CM

## 2023-01-25 DIAGNOSIS — R002 Palpitations: Secondary | ICD-10-CM | POA: Diagnosis not present

## 2023-01-27 ENCOUNTER — Ambulatory Visit: Payer: Medicare Other

## 2023-02-14 DIAGNOSIS — R002 Palpitations: Secondary | ICD-10-CM | POA: Diagnosis not present

## 2023-02-14 DIAGNOSIS — I479 Paroxysmal tachycardia, unspecified: Secondary | ICD-10-CM | POA: Diagnosis not present

## 2023-02-17 ENCOUNTER — Ambulatory Visit
Admission: RE | Admit: 2023-02-17 | Discharge: 2023-02-17 | Disposition: A | Discharge status: Home / Self Care | Payer: Medicare Other | Source: Ambulatory Visit | Attending: Internal Medicine | Admitting: Internal Medicine

## 2023-02-17 DIAGNOSIS — Z1231 Encounter for screening mammogram for malignant neoplasm of breast: Secondary | ICD-10-CM

## 2023-03-14 DIAGNOSIS — E1159 Type 2 diabetes mellitus with other circulatory complications: Secondary | ICD-10-CM | POA: Diagnosis not present

## 2023-03-14 DIAGNOSIS — M25552 Pain in left hip: Secondary | ICD-10-CM | POA: Diagnosis not present

## 2023-03-14 DIAGNOSIS — M1612 Unilateral primary osteoarthritis, left hip: Secondary | ICD-10-CM | POA: Diagnosis not present

## 2023-03-14 DIAGNOSIS — M1712 Unilateral primary osteoarthritis, left knee: Secondary | ICD-10-CM | POA: Diagnosis not present

## 2023-03-14 DIAGNOSIS — M25562 Pain in left knee: Secondary | ICD-10-CM | POA: Diagnosis not present

## 2023-03-14 DIAGNOSIS — M25862 Other specified joint disorders, left knee: Secondary | ICD-10-CM | POA: Diagnosis not present

## 2023-03-14 DIAGNOSIS — M5416 Radiculopathy, lumbar region: Secondary | ICD-10-CM | POA: Diagnosis not present

## 2023-03-18 DIAGNOSIS — E559 Vitamin D deficiency, unspecified: Secondary | ICD-10-CM | POA: Diagnosis not present

## 2023-03-18 DIAGNOSIS — E039 Hypothyroidism, unspecified: Secondary | ICD-10-CM | POA: Diagnosis not present

## 2023-03-18 DIAGNOSIS — E119 Type 2 diabetes mellitus without complications: Secondary | ICD-10-CM | POA: Diagnosis not present

## 2023-03-18 DIAGNOSIS — I10 Essential (primary) hypertension: Secondary | ICD-10-CM | POA: Diagnosis not present

## 2023-03-22 ENCOUNTER — Ambulatory Visit: Payer: Medicare Other | Attending: Student

## 2023-03-22 DIAGNOSIS — R262 Difficulty in walking, not elsewhere classified: Secondary | ICD-10-CM | POA: Insufficient documentation

## 2023-03-22 DIAGNOSIS — M25562 Pain in left knee: Secondary | ICD-10-CM | POA: Insufficient documentation

## 2023-03-22 DIAGNOSIS — M25552 Pain in left hip: Secondary | ICD-10-CM | POA: Diagnosis not present

## 2023-03-22 NOTE — Therapy (Signed)
OUTPATIENT PHYSICAL THERAPY EVALUATION   Patient Name: Paula [name] MRN: [id number] DOB:1950/06/03, 73 y.o., female Today's Date: 03/22/2023  END OF SESSION:  PT End of Session - 03/22/23 1119     Visit Number 1    Number of Visits 17    Date for PT Re-Evaluation 05/20/23    PT Start Time 1120    PT Stop Time 1210    PT Time Calculation (min) 50 min    Activity Tolerance Patient tolerated treatment well    Behavior During Therapy WFL for tasks assessed/performed             Past Medical History:  Diagnosis Date   Aortic valve insufficiency    Asthma    Diabetes (HCC)    type II   Family history of heart disease    mother, son, and grandparent   GERD (gastroesophageal reflux disease)    Hiatal hernia    History of small bowel obstruction    HTN (hypertension)    Hyperlipidemia    Osteoarthritis    Palpitations    Paresthesias    left-sided   Thyroid nodule    Varicose vein    Past Surgical History:  Procedure Laterality Date   ABDOMINAL HYSTERECTOMY  02/22/2001   BREAST BIOPSY Right 04/07/2020   benign   CATARACT EXTRACTION W/PHACO Right 02/03/2022   Procedure: CATARACT EXTRACTION PHACO AND INTRAOCULAR LENS PLACEMENT (IOC) RIGHT DIABETIC 23.48 02:14.4;  Surgeon: Lockie Mola, MD;  Location: [facility];  Service: Ophthalmology;  Laterality: Right;   CATARACT EXTRACTION W/PHACO Left 03/31/2022   Procedure: CATARACT EXTRACTION PHACO AND INTRAOCULAR LENS PLACEMENT (IOC) LEFT DIABETIC 13.16 01:04.0;  Surgeon: Lockie Mola, MD;  Location: [facility];  Service: Ophthalmology;  Laterality: Left;  Diabetic   COLON SURGERY  02/23/2003   colon blockage due to adhesions   GALLBLADDER SURGERY  02/23/1972   HERNIA REPAIR  02/22/2001   incisional   TOTAL HIP ARTHROPLASTY Right 11/25/2016   Procedure: RIGHT TOTAL HIP ARTHROPLASTY ANTERIOR APPROACH;  Surgeon: Samson Frederic, MD;  Location: [facility];  Service: Orthopedics;  Laterality:  Right;  NEEDS RNFA   TUBAL LIGATION  02/23/1988   UMBILICAL HERNIA REPAIR  02/23/1995   Patient Active Problem List   Diagnosis Date Noted   Coronary artery calcification 12/09/2022   Osteoarthritis of right hip 11/25/2016   Essential hypertension 08/01/2013   Hyperlipidemia 08/01/2013   Diabetes (HCC) 08/01/2013   Sleep apnea 08/01/2013   Morbid obesity (HCC) 08/01/2013   Palpitations 08/01/2013   Paresthesias 08/01/2013    PCP: Merri Brunette, MD   REFERRING PROVIDER: Anson Oregon, PA-C  REFERRING DIAG: Diagnosis M25.562 (ICD-10-CM) - Left knee pain M25.552 (ICD-10-CM) - Left hip pain  Rationale for Evaluation and Treatment: Rehabilitation  THERAPY DIAG:  Pain in left hip - Plan: PT plan of care cert/re-cert  Left knee pain, unspecified chronicity - Plan: PT plan of care cert/re-cert  Difficulty in walking, not elsewhere classified - Plan: PT plan of care cert/re-cert  ONSET DATE: L hip: several years ago, L knee pain 3 months ago  SUBJECTIVE:  SUBJECTIVE STATEMENT: L hip pain (posterior lateral): 1/10 currently (pt sitting on a chair); 6/10 at worst for the past 3 months  L knee joint pain: 1/10 burning, 8/10 at worst for the past 3 months (other than that, 4/10 after taking her ibuprofen)  L lateral leg and lateral thigh burning at times (around L5 dermatome and superficial peroneal nerve distribution)  PERTINENT HISTORY:  L hip and knee pain. L hip has been bothering her for the past several years, gradual onset and progressively worsened. L knee pain started 3 months ago, sudden onset, unknown mechanism of injury,  swelled up and had to wear a knee brace with patellar cutout which helped. Took ibuprofen for about a week which helped the knee pain. L knee pain has improved  since 3 months. Started using a SPC on R side last month because of her L hip pain. Had R THA 2018. Also has arthritis L hip (bone on bone), also has L knee arthritis.   HTN controlled per pt.   PAIN:  Are you having pain? Yes: NPRS scale: 1/10 Pain location: L hip Pain description: burning, stiff, sharp Aggravating factors: L hip flexion, abduction, ER; walking, first thing in the morning, stair negotiation, standing (feels an ache), standing up from sitting Relieving factors: using her SPC, as the day goes on.     Are you having pain? Yes: NPRS scale: 1/10 Pain location: L knee Pain description: burning, gives way, aching Aggravating factors: stair negotiation, walking, pressure, pushing with her L LE to push desk chair back, standing up from sitting,  Relieving factors: knee brace, ibuprofen     PRECAUTIONS: None  RED FLAGS: Bowel or bladder incontinence: No and Cauda equina syndrome: No   WEIGHT BEARING RESTRICTIONS: No  FALLS:  Has patient fallen in last 6 months? No  LIVING ENVIRONMENT: Lives with: lives with their spouse Lives in: House/apartment Stairs: Yes: External: 5 steps; on right going up Has following equipment at home: Single point cane, Walker - 2 wheeled, and shower chair  OCCUPATION: retired   PLOF: Independent  PATIENT GOALS: be able to get around without hurting. Avoid L hip surgery if possible. Be able to do her regular stuff without pain.   NEXT MD VISIT: April 25, 2023  OBJECTIVE:  Note: Objective measures were completed at Evaluation unless otherwise noted.    PATIENT SURVEYS:  LEFS 38/80 (03/22/2023)  COGNITION: Overall cognitive status: Within functional limits for tasks assessed     SENSATION: WFL  MUSCLE LENGTH:   POSTURE: forward neck, L cervical side bend, upper thoracic kyphosis, B protracted shoulders, L shoulder lower, L trunk side bend, L iliac crest, greater trochanter, knee lower than R, B foot pronation, L lumbar side  bend around L4/5 area with movement preference around that area.   Decreased L hip pain in standing with L foot arch supported.     PALPATION: TTP L greater trochanter, L knee (tibial and femoral surfaces), medial and lateral L patella, L posterior hip    LUMBAR ROM:   AROM eval  Flexion WFL  Extension Limited with L lateral hip pain (along the L5 dermatome)  Right lateral flexion Limited with L lateral hip and thigh pain.   Left lateral flexion limited  Right rotation full  Left rotation WFL with reproduction of L L5 dermatome burning.    (Blank rows = not tested)  LOWER EXTREMITY ROM:     Passive  Right eval Left eval  Hip flexion    Hip  extension    Hip abduction    Hip adduction    Hip internal rotation 20 -2  Hip external rotation    Knee flexion    Knee extension    Ankle dorsiflexion    Ankle plantarflexion    Ankle inversion    Ankle eversion     (Blank rows = not tested)  LOWER EXTREMITY MMT:    MMT Right eval Left eval  Hip flexion 4 4 with L posterior hip joint pain  Hip extension (seated manually resisted) 4 4-  Hip abduction (seated manually resisted) 4 4-  Hip adduction    Hip internal rotation    Hip external rotation    Knee flexion 4+ 4  Knee extension 5 5  Ankle dorsiflexion    Ankle plantarflexion    Ankle inversion    Ankle eversion     (Blank rows = not tested)  LUMBAR SPECIAL TESTS:  (+) repeated flexion test with reproduction of L posterior hip and proximal thigh pain (+) Slump (decreased L knee pain with upright posture)    FUNCTIONAL TESTS:    GAIT: Distance walked: 30 ft Assistive device utilized: Single point cane Level of assistance: Modified independence Comments: antalgic, decreased stance L LE, SPC on R   TREATMENT DATE: 03/22/2023                                                                                                                                 PATIENT EDUCATION:  Education details: POC Person  educated: Patient Education method: Explanation Education comprehension: verbalized understanding  HOME EXERCISE PROGRAM:   ASSESSMENT:  CLINICAL IMPRESSION: Patient is a 73 y.o. female who was seen today for physical therapy evaluation and treatment for L hip and knee pain. She also presents with altered gait pattern and posture, limited L > R hip IR ROM, TTP L posterior hip, greater trochanter and L knee joint and patella, trunk and bilateral hip weakness, reproduction of L LE radiating symptoms (L5 dermatome) with lumbar extension, L rotation, as well as R side bending, and difficulty performing tasks which involve standing and walking secondary to pain. Pt will benefit from skilled physical therapy services to address the aforementioned deficits.      OBJECTIVE IMPAIRMENTS: Abnormal gait, difficulty walking, decreased ROM, decreased strength, improper body mechanics, postural dysfunction, and pain.   ACTIVITY LIMITATIONS: carrying, lifting, bending, standing, squatting, stairs, transfers, toileting, and locomotion level  PARTICIPATION LIMITATIONS:   PERSONAL FACTORS: Age, Fitness, Past/current experiences, Time since onset of injury/illness/exacerbation, and 3+ comorbidities: Aortic valve insufficiency, asthma, diabetes, HTN, osteoarthritis  are also affecting patient's functional outcome.   REHAB POTENTIAL: Fair    CLINICAL DECISION MAKING: Evolving/moderate complexity worsening L hip pain per pt.  EVALUATION COMPLEXITY: Moderate   GOALS: Goals reviewed with patient? Yes  SHORT TERM GOALS: Target date: 04/01/2023  Pt will be independent with her initial HEP to decrease pain, improve strength, function, and ability  to perform standing and walking tasks more comfortably for her L hip and knee.  Baseline: Pt has not yet started her initial HEP (03/22/2023) Goal status: INITIAL   LONG TERM GOALS: Target date: 05/20/2023  Pt will have a decrease in L hip pain to 3/10 or less at  worst to promote ability to perform standing tasks, stair negotiation, and ambulate more comfortably.  Baseline: 6/10 at worst for the past 3 months (03/22/2023) Goal status: INITIAL  2.  Pt will have a decrease in L knee pain to 2/10 or less at worst to promote ability to ambulate, negotiate stairs, and perform standing tasks more comfortably.  Baseline: 8/10 at worst for the past 3 months (03/22/2023) Goal status: INITIAL  3.  Pt will improve B hip extension and abduction strength by at least 1/2 MMT grade to promote ability to ambulate, negotiate stairs, and perform standing tasks more comfortably.  Baseline:  MMT Right eval Left eval  Hip extension (seated manually resisted) 4 4-  Hip abduction (seated manually resisted) 4 4-  (03/22/2023)  Goal status: INITIAL  4.  Pt will improve her LEFS score by at least 10 points as a demonstration of improved function.  Baseline: LEFS score 38 (03/22/2023) Goal status: INITIAL    PLAN:  PT FREQUENCY: 1-2x/week  PT DURATION: 8 weeks  PLANNED INTERVENTIONS: 97110-Therapeutic exercises, 97530- Therapeutic activity, 97112- Neuromuscular re-education, 97535- Self Care, 16109- Manual therapy, [phone]- Gait training, [id number]- Aquatic Therapy, 97014- Electrical stimulation (unattended), [phone]- Traction (mechanical), [id number]- Ionotophoresis 4mg /ml Dexamethasone, Patient/Family education, Balance training, Stair training, Dry Needling, Joint mobilization, Spinal mobilization, and Vestibular training.  PLAN FOR NEXT SESSION: posture, thoracic extension, trunk and glute strengthening, lumbopelvic and femoral control, manual techniques, modalities PRN   Deolinda Frid, PT, DPT 03/22/2023, 12:58 PM

## 2023-03-23 DIAGNOSIS — I2510 Atherosclerotic heart disease of native coronary artery without angina pectoris: Secondary | ICD-10-CM | POA: Diagnosis not present

## 2023-03-23 DIAGNOSIS — E039 Hypothyroidism, unspecified: Secondary | ICD-10-CM | POA: Diagnosis not present

## 2023-03-23 DIAGNOSIS — E559 Vitamin D deficiency, unspecified: Secondary | ICD-10-CM | POA: Diagnosis not present

## 2023-03-23 DIAGNOSIS — E1142 Type 2 diabetes mellitus with diabetic polyneuropathy: Secondary | ICD-10-CM | POA: Diagnosis not present

## 2023-03-23 DIAGNOSIS — E7800 Pure hypercholesterolemia, unspecified: Secondary | ICD-10-CM | POA: Diagnosis not present

## 2023-03-23 DIAGNOSIS — I10 Essential (primary) hypertension: Secondary | ICD-10-CM | POA: Diagnosis not present

## 2023-03-23 DIAGNOSIS — Z794 Long term (current) use of insulin: Secondary | ICD-10-CM | POA: Diagnosis not present

## 2023-03-23 DIAGNOSIS — Z0000 Encounter for general adult medical examination without abnormal findings: Secondary | ICD-10-CM | POA: Diagnosis not present

## 2023-03-24 ENCOUNTER — Ambulatory Visit: Payer: Medicare Other

## 2023-04-15 ENCOUNTER — Ambulatory Visit: Payer: Medicare Other

## 2023-04-20 ENCOUNTER — Ambulatory Visit: Payer: Medicare Other | Attending: Student

## 2023-04-20 DIAGNOSIS — R262 Difficulty in walking, not elsewhere classified: Secondary | ICD-10-CM | POA: Diagnosis not present

## 2023-04-20 DIAGNOSIS — M25562 Pain in left knee: Secondary | ICD-10-CM | POA: Diagnosis not present

## 2023-04-20 DIAGNOSIS — M25552 Pain in left hip: Secondary | ICD-10-CM | POA: Insufficient documentation

## 2023-04-20 NOTE — Therapy (Signed)
 OUTPATIENT PHYSICAL THERAPY EVALUATION   Patient Name: Dazani Norby MRN: [id number] DOB:09/26/1950, 73 y.o., female Today's Date: 04/20/2023  END OF SESSION:  PT End of Session - 04/20/23 0903     Visit Number 2    Number of Visits 17    Date for PT Re-Evaluation 05/20/23    PT Start Time 0903    PT Stop Time 0941    PT Time Calculation (min) 38 min    Activity Tolerance Patient tolerated treatment well    Behavior During Therapy [facility] for tasks assessed/performed              Past Medical History:  Diagnosis Date   Aortic valve insufficiency    Asthma    Diabetes (HCC)    type II   Family history of heart disease    mother, son, and grandparent   GERD (gastroesophageal reflux disease)    Hiatal hernia    History of small bowel obstruction    HTN (hypertension)    Hyperlipidemia    Osteoarthritis    Palpitations    Paresthesias    left-sided   Thyroid nodule    Varicose vein    Past Surgical History:  Procedure Laterality Date   ABDOMINAL HYSTERECTOMY  02/22/2001   BREAST BIOPSY Right 04/07/2020   benign   CATARACT EXTRACTION W/PHACO Right 02/03/2022   Procedure: CATARACT EXTRACTION PHACO AND INTRAOCULAR LENS PLACEMENT (IOC) RIGHT DIABETIC 23.48 02:14.4;  Surgeon: Lockie Mola, MD;  Location: [facility];  Service: Ophthalmology;  Laterality: Right;   CATARACT EXTRACTION W/PHACO Left 03/31/2022   Procedure: CATARACT EXTRACTION PHACO AND INTRAOCULAR LENS PLACEMENT (IOC) LEFT DIABETIC 13.16 01:04.0;  Surgeon: Lockie Mola, MD;  Location: [facility];  Service: Ophthalmology;  Laterality: Left;  Diabetic   COLON SURGERY  02/23/2003   colon blockage due to adhesions   GALLBLADDER SURGERY  02/23/1972   HERNIA REPAIR  02/22/2001   incisional   TOTAL HIP ARTHROPLASTY Right 11/25/2016   Procedure: RIGHT TOTAL HIP ARTHROPLASTY ANTERIOR APPROACH;  Surgeon: Samson Frederic, MD;  Location: [facility];  Service: Orthopedics;  Laterality:  Right;  NEEDS RNFA   TUBAL LIGATION  02/23/1988   UMBILICAL HERNIA REPAIR  02/23/1995   Patient Active Problem List   Diagnosis Date Noted   Coronary artery calcification 12/09/2022   Osteoarthritis of right hip 11/25/2016   Essential hypertension 08/01/2013   Hyperlipidemia 08/01/2013   Diabetes (HCC) 08/01/2013   Sleep apnea 08/01/2013   Morbid obesity (HCC) 08/01/2013   Palpitations 08/01/2013   Paresthesias 08/01/2013    PCP: Merri Brunette, MD   REFERRING PROVIDER: Anson Oregon, PA-C  REFERRING DIAG: Diagnosis M25.562 (ICD-10-CM) - Left knee pain M25.552 (ICD-10-CM) - Left hip pain  Rationale for Evaluation and Treatment: Rehabilitation  THERAPY DIAG:  Pain in left hip  Left knee pain, unspecified chronicity  Difficulty in walking, not elsewhere classified  ONSET DATE: L hip: several years ago, L knee pain 3 months ago  SUBJECTIVE:  SUBJECTIVE STATEMENT: L hip pain (posterior lateral): Has been hurting for the past couple of days. 2/10 L hip pain when walking. L hip usually feels better when sitting.   L knee swelled up some. No L knee pain currently when sitting and walking.   Got arch supports which helped with the knee.      PERTINENT HISTORY:  L hip and knee pain. L hip has been bothering her for the past several years, gradual onset and progressively worsened. L knee pain started 3 months ago, sudden onset, unknown mechanism of injury,  swelled up and had to wear a knee brace with patellar cutout which helped. Took ibuprofen for about a week which helped the knee pain. L knee pain has improved since 3 months. Started using a SPC on R side last month because of her L hip pain. Had R THA 2018. Also has arthritis L hip (bone on bone), also has L knee arthritis.   HTN  controlled per pt.   PAIN:  Are you having pain? Yes: NPRS scale: 1/10 Pain location: L hip Pain description: burning, stiff, sharp Aggravating factors: L hip flexion, abduction, ER; walking, first thing in the morning, stair negotiation, standing (feels an ache), standing up from sitting Relieving factors: using her SPC, as the day goes on.     Are you having pain? Yes: NPRS scale: 1/10 Pain location: L knee Pain description: burning, gives way, aching Aggravating factors: stair negotiation, walking, pressure, pushing with her L LE to push desk chair back, standing up from sitting,  Relieving factors: knee brace, ibuprofen     PRECAUTIONS: None  RED FLAGS: Bowel or bladder incontinence: No and Cauda equina syndrome: No   WEIGHT BEARING RESTRICTIONS: No  FALLS:  Has patient fallen in last 6 months? No  LIVING ENVIRONMENT: Lives with: lives with their spouse Lives in: House/apartment Stairs: Yes: External: 5 steps; on right going up Has following equipment at home: Single point cane, Walker - 2 wheeled, and shower chair  OCCUPATION: retired   PLOF: Independent  PATIENT GOALS: be able to get around without hurting. Avoid L hip surgery if possible. Be able to do her regular stuff without pain.   NEXT MD VISIT: April 25, 2023  OBJECTIVE:  Note: Objective measures were completed at Evaluation unless otherwise noted.    PATIENT SURVEYS:  LEFS 38/80 (03/22/2023)  COGNITION: Overall cognitive status: Within functional limits for tasks assessed     SENSATION: WFL  MUSCLE LENGTH:   POSTURE: forward neck, L cervical side bend, upper thoracic kyphosis, B protracted shoulders, L shoulder lower, L trunk side bend, L iliac crest, greater trochanter, knee lower than R, B foot pronation, L lumbar side bend around L4/5 area with movement preference around that area.   Decreased L hip pain in standing with L foot arch supported.     PALPATION: TTP L greater trochanter,  L knee (tibial and femoral surfaces), medial and lateral L patella, L posterior hip    LUMBAR ROM:   AROM eval  Flexion WFL  Extension Limited with L lateral hip pain (along the L5 dermatome)  Right lateral flexion Limited with L lateral hip and thigh pain.   Left lateral flexion limited  Right rotation full  Left rotation WFL with reproduction of L L5 dermatome burning.    (Blank rows = not tested)  LOWER EXTREMITY ROM:     Passive  Right eval Left eval  Hip flexion    Hip extension  Hip abduction    Hip adduction    Hip internal rotation 20 -2  Hip external rotation    Knee flexion    Knee extension    Ankle dorsiflexion    Ankle plantarflexion    Ankle inversion    Ankle eversion     (Blank rows = not tested)  LOWER EXTREMITY MMT:    MMT Right eval Left eval  Hip flexion 4 4 with L posterior hip joint pain  Hip extension (seated manually resisted) 4 4-  Hip abduction (seated manually resisted) 4 4-  Hip adduction    Hip internal rotation    Hip external rotation    Knee flexion 4+ 4  Knee extension 5 5  Ankle dorsiflexion    Ankle plantarflexion    Ankle inversion    Ankle eversion     (Blank rows = not tested)  LUMBAR SPECIAL TESTS:  (+) repeated flexion test with reproduction of L posterior hip and proximal thigh pain (+) Slump (decreased L knee pain with upright posture)    FUNCTIONAL TESTS:    GAIT: Distance walked: 30 ft Assistive device utilized: Single point cane Level of assistance: Modified independence Comments: antalgic, decreased stance L LE, SPC on R   TREATMENT DATE: 04/20/2023                                                                                                                               Therapeutic exercise  Seated L piriformis stretch 45 seconds, 30 seconds   L hip discomfort which eases off quickly with rest.   L peroneal nerve area burning sensation   Seated hip adduction isometrics, small ball squeeze  10x5 seconds for 2 sets, then 8x5 seconds  Increased L lateral leg symptoms  Seated hip IR L 10x  Seated trunk flexion stretch 10x10 seconds   Seated thoracic extension at chair 10x5 seconds for 3 sets to decrease low back extension stress.   Sitting with lumbar towel roll. No change in L lateral leg symptoms   Standing R trunk side bend to decrease L low back pressure 10x2 with 5 seconds   Seated R trunk side bend stretch 10x5 seconds for 3 sets  Decreased L lateral leg paresthesia.       Improved exercise technique, movement at target joints, use of target muscles after mod verbal, visual, tactile cues.     PATIENT EDUCATION:  Education details: there-ex, HEP Person educated: Patient Education method: Explanation, Demonstration, Tactile cues, Verbal cues, and Handouts Education comprehension: verbalized understanding and returned demonstration  HOME EXERCISE PROGRAM: Access Code: [email or web address] URL: [email or web address] Date: 04/20/2023 Prepared by: Loralyn Freshwater  Exercises - Seated Piriformis Stretch  - 3 x daily - 7 x weekly - 1 sets - 3 reps - 30 seconds hold - Seated Thoracic Lumbar Extension  - 1 x daily - 7 x weekly - 3 sets - 10 reps - 5 seconds hold  ASSESSMENT:  CLINICAL  IMPRESSION:   Worked on improving L hip IR, thoracic extension and R trunk side bending to decrease pressure to L low back. Decreased L lateral leg burning sensation with R lumbar side bend exercise in sitting. No L hip pain reported during gait after session. Pt will benefit from continued skilled physical therapy services to decrease pain, improve strength and function.         OBJECTIVE IMPAIRMENTS: Abnormal gait, difficulty walking, decreased ROM, decreased strength, improper body mechanics, postural dysfunction, and pain.   ACTIVITY LIMITATIONS: carrying, lifting, bending, standing, squatting, stairs, transfers, toileting, and locomotion level  PARTICIPATION LIMITATIONS:    PERSONAL FACTORS: Age, Fitness, Past/current experiences, Time since onset of injury/illness/exacerbation, and 3+ comorbidities: Aortic valve insufficiency, asthma, diabetes, HTN, osteoarthritis  are also affecting patient's functional outcome.   REHAB POTENTIAL: Fair    CLINICAL DECISION MAKING: Evolving/moderate complexity worsening L hip pain per pt.  EVALUATION COMPLEXITY: Moderate   GOALS: Goals reviewed with patient? Yes  SHORT TERM GOALS: Target date: 04/01/2023  Pt will be independent with her initial HEP to decrease pain, improve strength, function, and ability to perform standing and walking tasks more comfortably for her L hip and knee.  Baseline: Pt has not yet started her initial HEP (03/22/2023) Goal status: INITIAL   LONG TERM GOALS: Target date: 05/20/2023  Pt will have a decrease in L hip pain to 3/10 or less at worst to promote ability to perform standing tasks, stair negotiation, and ambulate more comfortably.  Baseline: 6/10 at worst for the past 3 months (03/22/2023) Goal status: INITIAL  2.  Pt will have a decrease in L knee pain to 2/10 or less at worst to promote ability to ambulate, negotiate stairs, and perform standing tasks more comfortably.  Baseline: 8/10 at worst for the past 3 months (03/22/2023) Goal status: INITIAL  3.  Pt will improve B hip extension and abduction strength by at least 1/2 MMT grade to promote ability to ambulate, negotiate stairs, and perform standing tasks more comfortably.  Baseline:  MMT Right eval Left eval  Hip extension (seated manually resisted) 4 4-  Hip abduction (seated manually resisted) 4 4-  (03/22/2023)  Goal status: INITIAL  4.  Pt will improve her LEFS score by at least 10 points as a demonstration of improved function.  Baseline: LEFS score 38 (03/22/2023) Goal status: INITIAL    PLAN:  PT FREQUENCY: 1-2x/week  PT DURATION: 8 weeks  PLANNED INTERVENTIONS: 97110-Therapeutic exercises, 97530-  Therapeutic activity, 97112- Neuromuscular re-education, 97535- Self Care, 29528- Manual therapy, [id number]- Gait training, [phone]- Aquatic Therapy, 97014- Electrical stimulation (unattended), [id number]- Traction (mechanical), [id number]- Ionotophoresis 4mg /ml Dexamethasone, Patient/Family education, Balance training, Stair training, Dry Needling, Joint mobilization, Spinal mobilization, and Vestibular training.  PLAN FOR NEXT SESSION: posture, thoracic extension, trunk and glute strengthening, lumbopelvic and femoral control, manual techniques, modalities PRN   Travell Desaulniers, PT, DPT 04/20/2023, 9:45 AM

## 2023-04-22 ENCOUNTER — Ambulatory Visit: Payer: Medicare Other

## 2023-04-22 DIAGNOSIS — M25562 Pain in left knee: Secondary | ICD-10-CM

## 2023-04-22 DIAGNOSIS — R262 Difficulty in walking, not elsewhere classified: Secondary | ICD-10-CM | POA: Diagnosis not present

## 2023-04-22 DIAGNOSIS — M25552 Pain in left hip: Secondary | ICD-10-CM

## 2023-04-22 NOTE — Therapy (Signed)
 OUTPATIENT PHYSICAL THERAPY EVALUATION   Patient Name: Jenina Moening MRN: [id number] DOB:Feb 20, 1951, 73 y.o., female Today's Date: 04/22/2023  END OF SESSION:  PT End of Session - 04/22/23 0815     Visit Number 3    Number of Visits 17    Date for PT Re-Evaluation 05/20/23    PT Start Time 0815    PT Stop Time 0854    PT Time Calculation (min) 39 min    Activity Tolerance Patient tolerated treatment well    Behavior During Therapy [facility] for tasks assessed/performed               Past Medical History:  Diagnosis Date   Aortic valve insufficiency    Asthma    Diabetes (HCC)    type II   Family history of heart disease    mother, son, and grandparent   GERD (gastroesophageal reflux disease)    Hiatal hernia    History of small bowel obstruction    HTN (hypertension)    Hyperlipidemia    Osteoarthritis    Palpitations    Paresthesias    left-sided   Thyroid nodule    Varicose vein    Past Surgical History:  Procedure Laterality Date   ABDOMINAL HYSTERECTOMY  02/22/2001   BREAST BIOPSY Right 04/07/2020   benign   CATARACT EXTRACTION W/PHACO Right 02/03/2022   Procedure: CATARACT EXTRACTION PHACO AND INTRAOCULAR LENS PLACEMENT (IOC) RIGHT DIABETIC 23.48 02:14.4;  Surgeon: Lockie Mola, MD;  Location: [facility];  Service: Ophthalmology;  Laterality: Right;   CATARACT EXTRACTION W/PHACO Left 03/31/2022   Procedure: CATARACT EXTRACTION PHACO AND INTRAOCULAR LENS PLACEMENT (IOC) LEFT DIABETIC 13.16 01:04.0;  Surgeon: Lockie Mola, MD;  Location: [facility];  Service: Ophthalmology;  Laterality: Left;  Diabetic   COLON SURGERY  02/23/2003   colon blockage due to adhesions   GALLBLADDER SURGERY  02/23/1972   HERNIA REPAIR  02/22/2001   incisional   TOTAL HIP ARTHROPLASTY Right 11/25/2016   Procedure: RIGHT TOTAL HIP ARTHROPLASTY ANTERIOR APPROACH;  Surgeon: Samson Frederic, MD;  Location: [facility];  Service: Orthopedics;   Laterality: Right;  NEEDS RNFA   TUBAL LIGATION  02/23/1988   UMBILICAL HERNIA REPAIR  02/23/1995   Patient Active Problem List   Diagnosis Date Noted   Coronary artery calcification 12/09/2022   Osteoarthritis of right hip 11/25/2016   Essential hypertension 08/01/2013   Hyperlipidemia 08/01/2013   Diabetes (HCC) 08/01/2013   Sleep apnea 08/01/2013   Morbid obesity (HCC) 08/01/2013   Palpitations 08/01/2013   Paresthesias 08/01/2013    PCP: Merri Brunette, MD   REFERRING PROVIDER: Anson Oregon, PA-C  REFERRING DIAG: Diagnosis M25.562 (ICD-10-CM) - Left knee pain M25.552 (ICD-10-CM) - Left hip pain  Rationale for Evaluation and Treatment: Rehabilitation  THERAPY DIAG:  Pain in left hip  Left knee pain, unspecified chronicity  Difficulty in walking, not elsewhere classified  ONSET DATE: L hip: several years ago, L knee pain 3 months ago  SUBJECTIVE:  SUBJECTIVE STATEMENT: L hip has been feeling pretty good for the last few days. The swelling in her L knee is better. Has some burning L lateral leg, medial and anterior knee, does not know why. 1/10 L hip and 0/10 L knee pain when walking currently.       PERTINENT HISTORY:  L hip and knee pain. L hip has been bothering her for the past several years, gradual onset and progressively worsened. L knee pain started 3 months ago, sudden onset, unknown mechanism of injury,  swelled up and had to wear a knee brace with patellar cutout which helped. Took ibuprofen for about a week which helped the knee pain. L knee pain has improved since 3 months. Started using a SPC on R side last month because of her L hip pain. Had R THA 2018. Also has arthritis L hip (bone on bone), also has L knee arthritis.   HTN controlled per pt.   PAIN:  Are  you having pain? Yes: NPRS scale: 1/10 Pain location: L hip Pain description: burning, stiff, sharp Aggravating factors: L hip flexion, abduction, ER; walking, first thing in the morning, stair negotiation, standing (feels an ache), standing up from sitting Relieving factors: using her SPC, as the day goes on.     Are you having pain? Yes: NPRS scale: 1/10 Pain location: L knee Pain description: burning, gives way, aching Aggravating factors: stair negotiation, walking, pressure, pushing with her L LE to push desk chair back, standing up from sitting,  Relieving factors: knee brace, ibuprofen     PRECAUTIONS: None  RED FLAGS: Bowel or bladder incontinence: No and Cauda equina syndrome: No   WEIGHT BEARING RESTRICTIONS: No  FALLS:  Has patient fallen in last 6 months? No  LIVING ENVIRONMENT: Lives with: lives with their spouse Lives in: House/apartment Stairs: Yes: External: 5 steps; on right going up Has following equipment at home: Single point cane, Walker - 2 wheeled, and shower chair  OCCUPATION: retired   PLOF: Independent  PATIENT GOALS: be able to get around without hurting. Avoid L hip surgery if possible. Be able to do her regular stuff without pain.   NEXT MD VISIT: April 25, 2023  OBJECTIVE:  Note: Objective measures were completed at Evaluation unless otherwise noted.    PATIENT SURVEYS:  LEFS 38/80 (03/22/2023)  COGNITION: Overall cognitive status: Within functional limits for tasks assessed     SENSATION: WFL  MUSCLE LENGTH:   POSTURE: forward neck, L cervical side bend, upper thoracic kyphosis, B protracted shoulders, L shoulder lower, L trunk side bend, L iliac crest, greater trochanter, knee lower than R, B foot pronation, L lumbar side bend around L4/5 area with movement preference around that area.   Decreased L hip pain in standing with L foot arch supported.     PALPATION: TTP L greater trochanter, L knee (tibial and femoral  surfaces), medial and lateral L patella, L posterior hip    LUMBAR ROM:   AROM eval  Flexion WFL  Extension Limited with L lateral hip pain (along the L5 dermatome)  Right lateral flexion Limited with L lateral hip and thigh pain.   Left lateral flexion limited  Right rotation full  Left rotation WFL with reproduction of L L5 dermatome burning.    (Blank rows = not tested)  LOWER EXTREMITY ROM:     Passive  Right eval Left eval  Hip flexion    Hip extension    Hip abduction    Hip adduction  Hip internal rotation 20 -2  Hip external rotation    Knee flexion    Knee extension    Ankle dorsiflexion    Ankle plantarflexion    Ankle inversion    Ankle eversion     (Blank rows = not tested)  LOWER EXTREMITY MMT:    MMT Right eval Left eval  Hip flexion 4 4 with L posterior hip joint pain  Hip extension (seated manually resisted) 4 4-  Hip abduction (seated manually resisted) 4 4-  Hip adduction    Hip internal rotation    Hip external rotation    Knee flexion 4+ 4  Knee extension 5 5  Ankle dorsiflexion    Ankle plantarflexion    Ankle inversion    Ankle eversion     (Blank rows = not tested)  LUMBAR SPECIAL TESTS:  (+) repeated flexion test with reproduction of L posterior hip and proximal thigh pain (+) Slump (decreased L knee pain with upright posture)    FUNCTIONAL TESTS:    GAIT: Distance walked: 30 ft Assistive device utilized: Single point cane Level of assistance: Modified independence Comments: antalgic, decreased stance L LE, SPC on R   TREATMENT DATE: 04/22/2023                                                                                                                               Therapeutic exercise  Supine L hip IR stretch with PT 1 minute x 3  Supine L hip IR PROM at 90/90 with PT 10x3  Hooklying   hip extension isometrics, leg straight    L 10x5 seconds x 3 sets   Posterior pelvic tilt 10x5 seconds for 3 sets to decrease  lumbar extension stress to L LE nerves   Hip adduction isometrics, small blue ball squeeze 10x3 with 5 second holds   Posterior pelvic tilt with march 10x3 each LE   Seated thoracic extension at chair 10x5 seconds for 2 sets to decrease low back extension stress.        Improved exercise technique, movement at target joints, use of target muscles after mod verbal, visual, tactile cues.     PATIENT EDUCATION:  Education details: there-ex, HEP Person educated: Patient Education method: Explanation, Demonstration, Tactile cues, Verbal cues, and Handouts Education comprehension: verbalized understanding and returned demonstration  HOME EXERCISE PROGRAM: Access Code: [email or web address] URL: [email or web address] Date: 04/20/2023 Prepared by: Loralyn Freshwater  Exercises - Seated Piriformis Stretch  - 3 x daily - 7 x weekly - 1 sets - 3 reps - 30 seconds hold - Seated Thoracic Lumbar Extension  - 1 x daily - 7 x weekly - 3 sets - 10 reps - 5 seconds hold - Seated Sidebending  - 3 x daily - 7 x weekly - 3 sets - 10 reps - 5 seconds hold - Supine Posterior Pelvic Tilt  - 1 x daily - 7 x weekly - 3 sets - 10 reps -  5 seconds hold  ASSESSMENT:  CLINICAL IMPRESSION:    Good response to previous session with reports of decrease L hip pain and L knee swelling. Continued working  on improving L hip IR, trunk and glute strength to help decrease pressure to her low back. No L lateral leg burning after session. Pt tolerated session well without aggravation of symptoms. Pt will benefit from continued skilled physical therapy services to decrease pain, improve strength and function.         OBJECTIVE IMPAIRMENTS: Abnormal gait, difficulty walking, decreased ROM, decreased strength, improper body mechanics, postural dysfunction, and pain.   ACTIVITY LIMITATIONS: carrying, lifting, bending, standing, squatting, stairs, transfers, toileting, and locomotion level  PARTICIPATION  LIMITATIONS:   PERSONAL FACTORS: Age, Fitness, Past/current experiences, Time since onset of injury/illness/exacerbation, and 3+ comorbidities: Aortic valve insufficiency, asthma, diabetes, HTN, osteoarthritis  are also affecting patient's functional outcome.   REHAB POTENTIAL: Fair    CLINICAL DECISION MAKING: Evolving/moderate complexity worsening L hip pain per pt.  EVALUATION COMPLEXITY: Moderate   GOALS: Goals reviewed with patient? Yes  SHORT TERM GOALS: Target date: 04/01/2023  Pt will be independent with her initial HEP to decrease pain, improve strength, function, and ability to perform standing and walking tasks more comfortably for her L hip and knee.  Baseline: Pt has not yet started her initial HEP (03/22/2023) Goal status: INITIAL   LONG TERM GOALS: Target date: 05/20/2023  Pt will have a decrease in L hip pain to 3/10 or less at worst to promote ability to perform standing tasks, stair negotiation, and ambulate more comfortably.  Baseline: 6/10 at worst for the past 3 months (03/22/2023) Goal status: INITIAL  2.  Pt will have a decrease in L knee pain to 2/10 or less at worst to promote ability to ambulate, negotiate stairs, and perform standing tasks more comfortably.  Baseline: 8/10 at worst for the past 3 months (03/22/2023) Goal status: INITIAL  3.  Pt will improve B hip extension and abduction strength by at least 1/2 MMT grade to promote ability to ambulate, negotiate stairs, and perform standing tasks more comfortably.  Baseline:  MMT Right eval Left eval  Hip extension (seated manually resisted) 4 4-  Hip abduction (seated manually resisted) 4 4-  (03/22/2023)  Goal status: INITIAL  4.  Pt will improve her LEFS score by at least 10 points as a demonstration of improved function.  Baseline: LEFS score 38 (03/22/2023) Goal status: INITIAL    PLAN:  PT FREQUENCY: 1-2x/week  PT DURATION: 8 weeks  PLANNED INTERVENTIONS: 97110-Therapeutic exercises,  97530- Therapeutic activity, 97112- Neuromuscular re-education, 97535- Self Care, 25956- Manual therapy, [phone]- Gait training, [id number]- Aquatic Therapy, 97014- Electrical stimulation (unattended), [id number]- Traction (mechanical), [id number]- Ionotophoresis 4mg /ml Dexamethasone, Patient/Family education, Balance training, Stair training, Dry Needling, Joint mobilization, Spinal mobilization, and Vestibular training.  PLAN FOR NEXT SESSION: posture, thoracic extension, trunk and glute strengthening, lumbopelvic and femoral control, manual techniques, modalities PRN   Aniqa Hare, PT, DPT 04/22/2023, 8:56 AM

## 2023-04-25 ENCOUNTER — Ambulatory Visit: Payer: Medicare Other | Attending: Student

## 2023-04-25 DIAGNOSIS — M5416 Radiculopathy, lumbar region: Secondary | ICD-10-CM | POA: Diagnosis not present

## 2023-04-25 DIAGNOSIS — M25552 Pain in left hip: Secondary | ICD-10-CM | POA: Insufficient documentation

## 2023-04-25 DIAGNOSIS — M25562 Pain in left knee: Secondary | ICD-10-CM | POA: Insufficient documentation

## 2023-04-25 DIAGNOSIS — R262 Difficulty in walking, not elsewhere classified: Secondary | ICD-10-CM | POA: Diagnosis not present

## 2023-04-25 DIAGNOSIS — M1712 Unilateral primary osteoarthritis, left knee: Secondary | ICD-10-CM | POA: Diagnosis not present

## 2023-04-25 DIAGNOSIS — M25862 Other specified joint disorders, left knee: Secondary | ICD-10-CM | POA: Diagnosis not present

## 2023-04-25 DIAGNOSIS — M1612 Unilateral primary osteoarthritis, left hip: Secondary | ICD-10-CM | POA: Diagnosis not present

## 2023-04-25 NOTE — Therapy (Signed)
 OUTPATIENT PHYSICAL THERAPY EVALUATION   Patient Name: Paula [name] MRN: [id number] DOB:[birth year], 73 y.o., female Today's Date: 04/25/2023  END OF SESSION:  PT End of Session - 04/25/23 0901     Visit Number 4    Number of Visits 17    Date for PT Re-Evaluation 05/20/23    PT Start Time 0901    PT Stop Time 0947    PT Time Calculation (min) 46 min    Activity Tolerance Patient tolerated treatment well    Behavior During Therapy [facility] for tasks assessed/performed                Past Medical History:  Diagnosis Date   Aortic valve insufficiency    Asthma    Diabetes (HCC)    type II   Family history of heart disease    mother, son, and grandparent   GERD (gastroesophageal reflux disease)    Hiatal hernia    History of small bowel obstruction    HTN (hypertension)    Hyperlipidemia    Osteoarthritis    Palpitations    Paresthesias    left-sided   Thyroid nodule    Varicose vein    Past Surgical History:  Procedure Laterality Date   ABDOMINAL HYSTERECTOMY  02/22/2001   BREAST BIOPSY Right 04/07/2020   benign   CATARACT EXTRACTION W/PHACO Right 02/03/2022   Procedure: CATARACT EXTRACTION PHACO AND INTRAOCULAR LENS PLACEMENT (IOC) RIGHT DIABETIC 23.48 02:14.4;  Surgeon: Lockie Mola, MD;  Location: [facility];  Service: Ophthalmology;  Laterality: Right;   CATARACT EXTRACTION W/PHACO Left 03/31/2022   Procedure: CATARACT EXTRACTION PHACO AND INTRAOCULAR LENS PLACEMENT (IOC) LEFT DIABETIC 13.16 01:04.0;  Surgeon: Lockie Mola, MD;  Location: [facility];  Service: Ophthalmology;  Laterality: Left;  Diabetic   COLON SURGERY  02/23/2003   colon blockage due to adhesions   GALLBLADDER SURGERY  02/23/1972   HERNIA REPAIR  02/22/2001   incisional   TOTAL HIP ARTHROPLASTY Right 11/25/2016   Procedure: RIGHT TOTAL HIP ARTHROPLASTY ANTERIOR APPROACH;  Surgeon: Samson Frederic, MD;  Location: [facility];  Service: Orthopedics;   Laterality: Right;  NEEDS RNFA   TUBAL LIGATION  02/23/1988   UMBILICAL HERNIA REPAIR  02/23/1995   Patient Active Problem List   Diagnosis Date Noted   Coronary artery calcification 12/09/2022   Osteoarthritis of right hip 11/25/2016   Essential hypertension 08/01/2013   Hyperlipidemia 08/01/2013   Diabetes (HCC) 08/01/2013   Sleep apnea 08/01/2013   Morbid obesity (HCC) 08/01/2013   Palpitations 08/01/2013   Paresthesias 08/01/2013    PCP: Merri Brunette, MD   REFERRING PROVIDER: Anson Oregon, PA-C  REFERRING DIAG: Diagnosis M25.562 (ICD-10-CM) - Left knee pain M25.552 (ICD-10-CM) - Left hip pain  Rationale for Evaluation and Treatment: Rehabilitation  THERAPY DIAG:  Pain in left hip  Left knee pain, unspecified chronicity  Difficulty in walking, not elsewhere classified  ONSET DATE: L hip: several years ago, L knee pain 3 months ago  SUBJECTIVE:  SUBJECTIVE STATEMENT: L hip is making improvements, 1/10 L hip pain, not bad currently when walking. L knee swelling is going down. No L lateral leg burning currently. Gets anterior L knee and medial L knee burning.     PERTINENT HISTORY:  L hip and knee pain. L hip has been bothering her for the past several years, gradual onset and progressively worsened. L knee pain started 3 months ago, sudden onset, unknown mechanism of injury,  swelled up and had to wear a knee brace with patellar cutout which helped. Took ibuprofen for about a week which helped the knee pain. L knee pain has improved since 3 months. Started using a SPC on R side last month because of her L hip pain. Had R THA 2018. Also has arthritis L hip (bone on bone), also has L knee arthritis.   HTN controlled per pt.   PAIN:  Are you having pain? Yes: NPRS scale:  1/10 Pain location: L hip Pain description: burning, stiff, sharp Aggravating factors: L hip flexion, abduction, ER; walking, first thing in the morning, stair negotiation, standing (feels an ache), standing up from sitting Relieving factors: using her SPC, as the day goes on.     Are you having pain? Yes: NPRS scale: 1/10 Pain location: L knee Pain description: burning, gives way, aching Aggravating factors: stair negotiation, walking, pressure, pushing with her L LE to push desk chair back, standing up from sitting,  Relieving factors: knee brace, ibuprofen     PRECAUTIONS: None  RED FLAGS: Bowel or bladder incontinence: No and Cauda equina syndrome: No   WEIGHT BEARING RESTRICTIONS: No  FALLS:  Has patient fallen in last 6 months? No  LIVING ENVIRONMENT: Lives with: lives with their spouse Lives in: House/apartment Stairs: Yes: External: 5 steps; on right going up Has following equipment at home: Single point cane, Walker - 2 wheeled, and shower chair  OCCUPATION: retired   PLOF: Independent  PATIENT GOALS: be able to get around without hurting. Avoid L hip surgery if possible. Be able to do her regular stuff without pain.   NEXT MD VISIT: April 25, 2023  OBJECTIVE:  Note: Objective measures were completed at Evaluation unless otherwise noted.    PATIENT SURVEYS:  LEFS 38/80 (03/22/2023)  COGNITION: Overall cognitive status: Within functional limits for tasks assessed     SENSATION: WFL  MUSCLE LENGTH:   POSTURE: forward neck, L cervical side bend, upper thoracic kyphosis, B protracted shoulders, L shoulder lower, L trunk side bend, L iliac crest, greater trochanter, knee lower than R, B foot pronation, L lumbar side bend around L4/5 area with movement preference around that area.   Decreased L hip pain in standing with L foot arch supported.     PALPATION: TTP L greater trochanter, L knee (tibial and femoral surfaces), medial and lateral L patella, L  posterior hip    LUMBAR ROM:   AROM eval  Flexion WFL  Extension Limited with L lateral hip pain (along the L5 dermatome)  Right lateral flexion Limited with L lateral hip and thigh pain.   Left lateral flexion limited  Right rotation full  Left rotation WFL with reproduction of L L5 dermatome burning.    (Blank rows = not tested)  LOWER EXTREMITY ROM:     Passive  Right eval Left eval  Hip flexion    Hip extension    Hip abduction    Hip adduction    Hip internal rotation 20 -2  Hip external rotation  Knee flexion    Knee extension    Ankle dorsiflexion    Ankle plantarflexion    Ankle inversion    Ankle eversion     (Blank rows = not tested)  LOWER EXTREMITY MMT:    MMT Right eval Left eval  Hip flexion 4 4 with L posterior hip joint pain  Hip extension (seated manually resisted) 4 4-  Hip abduction (seated manually resisted) 4 4-  Hip adduction    Hip internal rotation    Hip external rotation    Knee flexion 4+ 4  Knee extension 5 5  Ankle dorsiflexion    Ankle plantarflexion    Ankle inversion    Ankle eversion     (Blank rows = not tested)  LUMBAR SPECIAL TESTS:  (+) repeated flexion test with reproduction of L posterior hip and proximal thigh pain (+) Slump (decreased L knee pain with upright posture)    FUNCTIONAL TESTS:    GAIT: Distance walked: 30 ft Assistive device utilized: Single point cane Level of assistance: Modified independence Comments: antalgic, decreased stance L LE, SPC on R   TREATMENT DATE: 04/25/2023                                                                                                                               Therapeutic exercise  Supine L hip IR stretch with PT 50 seconds. L lateral leg burning  Supine L hip IR PROM at 90/90 with PT 10x3  Hooklying   Posterior pelvic tilt 10x5 seconds for 3 sets to decrease lumbar extension stress to L LE nerves   Supine L piriformis stretch with towel assist 1  minute x 3   L lateral leg burning.    hip extension isometrics, leg straight    L 10x5 seconds x 3 sets   SKTC L 10x2 with 10 second holds    Sitting with lumbar towel roll   Then with gentle lumbar extension 10x5 seconds   No change in L lateral leg symptoms   Improved exercise technique, movement at target joints, use of target muscles after mod verbal, visual, tactile cues.   Manual therapy  Supine STM L lateral hamstrings to decrease tension   Supine L tibial IR glide grad 1-2   Improved L knee joint relaxation      PATIENT EDUCATION:  Education details: there-ex, HEP Person educated: Patient Education method: Explanation, Demonstration, Tactile cues, Verbal cues, and Handouts Education comprehension: verbalized understanding and returned demonstration  HOME EXERCISE PROGRAM: Access Code: [email or web address] URL: [email or web address] Date: 04/20/2023 Prepared by: Loralyn Freshwater  Exercises - Seated Piriformis Stretch  - 3 x daily - 7 x weekly - 1 sets - 3 reps - 30 seconds hold  Put on hold on 04/25/2023 secondary to L lateral leg burning  - Seated Thoracic Lumbar Extension  - 1 x daily - 7 x weekly - 3 sets - 10 reps - 5 seconds hold - Seated  Sidebending  - 3 x daily - 7 x weekly - 3 sets - 10 reps - 5 seconds hold - Supine Posterior Pelvic Tilt  - 1 x daily - 7 x weekly - 3 sets - 10 reps - 5 seconds hold  ASSESSMENT:  CLINICAL IMPRESSION:   Improving L hip and knee pain based on subjective reports. Continued working  on improving L hip IR, trunk and glute strength to help decrease pressure to her low back. Worked on decreasing L lateral hamstring muscle tension as well and utilized gentle IR mob to L tibia which helped with knee comfort, but no change in L lateral leg symptoms. Fair tolerance to today's session.  Pt will benefit from continued skilled physical therapy services to decrease pain, improve strength and function.         OBJECTIVE  IMPAIRMENTS: Abnormal gait, difficulty walking, decreased ROM, decreased strength, improper body mechanics, postural dysfunction, and pain.   ACTIVITY LIMITATIONS: carrying, lifting, bending, standing, squatting, stairs, transfers, toileting, and locomotion level  PARTICIPATION LIMITATIONS:   PERSONAL FACTORS: Age, Fitness, Past/current experiences, Time since onset of injury/illness/exacerbation, and 3+ comorbidities: Aortic valve insufficiency, asthma, diabetes, HTN, osteoarthritis  are also affecting patient's functional outcome.   REHAB POTENTIAL: Fair    CLINICAL DECISION MAKING: Evolving/moderate complexity worsening L hip pain per pt.  EVALUATION COMPLEXITY: Moderate   GOALS: Goals reviewed with patient? Yes  SHORT TERM GOALS: Target date: 04/01/2023  Pt will be independent with her initial HEP to decrease pain, improve strength, function, and ability to perform standing and walking tasks more comfortably for her L hip and knee.  Baseline: Pt has not yet started her initial HEP (03/22/2023) Goal status: INITIAL   LONG TERM GOALS: Target date: 05/20/2023  Pt will have a decrease in L hip pain to 3/10 or less at worst to promote ability to perform standing tasks, stair negotiation, and ambulate more comfortably.  Baseline: 6/10 at worst for the past 3 months (03/22/2023) Goal status: INITIAL  2.  Pt will have a decrease in L knee pain to 2/10 or less at worst to promote ability to ambulate, negotiate stairs, and perform standing tasks more comfortably.  Baseline: 8/10 at worst for the past 3 months (03/22/2023) Goal status: INITIAL  3.  Pt will improve B hip extension and abduction strength by at least 1/2 MMT grade to promote ability to ambulate, negotiate stairs, and perform standing tasks more comfortably.  Baseline:  MMT Right eval Left eval  Hip extension (seated manually resisted) 4 4-  Hip abduction (seated manually resisted) 4 4-  (03/22/2023)  Goal status:  INITIAL  4.  Pt will improve her LEFS score by at least 10 points as a demonstration of improved function.  Baseline: LEFS score 38 (03/22/2023) Goal status: INITIAL    PLAN:  PT FREQUENCY: 1-2x/week  PT DURATION: 8 weeks  PLANNED INTERVENTIONS: 97110-Therapeutic exercises, 97530- Therapeutic activity, 97112- Neuromuscular re-education, 97535- Self Care, 16109- Manual therapy, [phone]- Gait training, [phone]- Aquatic Therapy, [id number]- Electrical stimulation (unattended), [phone]- Traction (mechanical), [id number]- Ionotophoresis 4mg /ml Dexamethasone, Patient/Family education, Balance training, Stair training, Dry Needling, Joint mobilization, Spinal mobilization, and Vestibular training.  PLAN FOR NEXT SESSION: posture, thoracic extension, trunk and glute strengthening, lumbopelvic and femoral control, manual techniques, modalities PRN   Verba Ainley, PT, DPT 04/25/2023, 11:00 AM

## 2023-04-28 ENCOUNTER — Ambulatory Visit: Payer: Medicare Other

## 2023-04-28 DIAGNOSIS — R262 Difficulty in walking, not elsewhere classified: Secondary | ICD-10-CM | POA: Diagnosis not present

## 2023-04-28 DIAGNOSIS — M25562 Pain in left knee: Secondary | ICD-10-CM

## 2023-04-28 DIAGNOSIS — M25552 Pain in left hip: Secondary | ICD-10-CM

## 2023-04-28 NOTE — Therapy (Signed)
 OUTPATIENT PHYSICAL THERAPY EVALUATION   Patient Name: Paula [name] MRN: [id number] DOB:[birth year], 73 y.o., female Today's Date: 04/28/2023  END OF SESSION:  PT End of Session - 04/28/23 0951     Visit Number 5    Number of Visits 17    Date for PT Re-Evaluation 05/20/23    PT Start Time 0951    PT Stop Time 1030    PT Time Calculation (min) 39 min    Activity Tolerance Patient tolerated treatment well    Behavior During Therapy WFL for tasks assessed/performed                 Past Medical History:  Diagnosis Date   Aortic valve insufficiency    Asthma    Diabetes (HCC)    type II   Family history of heart disease    mother, son, and grandparent   GERD (gastroesophageal reflux disease)    Hiatal hernia    History of small bowel obstruction    HTN (hypertension)    Hyperlipidemia    Osteoarthritis    Palpitations    Paresthesias    left-sided   Thyroid nodule    Varicose vein    Past Surgical History:  Procedure Laterality Date   ABDOMINAL HYSTERECTOMY  02/22/2001   BREAST BIOPSY Right 04/07/2020   benign   CATARACT EXTRACTION W/PHACO Right 02/03/2022   Procedure: CATARACT EXTRACTION PHACO AND INTRAOCULAR LENS PLACEMENT (IOC) RIGHT DIABETIC 23.48 02:14.4;  Surgeon: Lockie Mola, MD;  Location: [facility];  Service: Ophthalmology;  Laterality: Right;   CATARACT EXTRACTION W/PHACO Left 03/31/2022   Procedure: CATARACT EXTRACTION PHACO AND INTRAOCULAR LENS PLACEMENT (IOC) LEFT DIABETIC 13.16 01:04.0;  Surgeon: Lockie Mola, MD;  Location: [facility];  Service: Ophthalmology;  Laterality: Left;  Diabetic   COLON SURGERY  02/23/2003   colon blockage due to adhesions   GALLBLADDER SURGERY  02/23/1972   HERNIA REPAIR  02/22/2001   incisional   TOTAL HIP ARTHROPLASTY Right 11/25/2016   Procedure: RIGHT TOTAL HIP ARTHROPLASTY ANTERIOR APPROACH;  Surgeon: Samson Frederic, MD;  Location: [facility];  Service: Orthopedics;   Laterality: Right;  NEEDS RNFA   TUBAL LIGATION  02/23/1988   UMBILICAL HERNIA REPAIR  02/23/1995   Patient Active Problem List   Diagnosis Date Noted   Coronary artery calcification 12/09/2022   Osteoarthritis of right hip 11/25/2016   Essential hypertension 08/01/2013   Hyperlipidemia 08/01/2013   Diabetes (HCC) 08/01/2013   Sleep apnea 08/01/2013   Morbid obesity (HCC) 08/01/2013   Palpitations 08/01/2013   Paresthesias 08/01/2013    PCP: Merri Brunette, MD   REFERRING PROVIDER: Anson Oregon, PA-C  REFERRING DIAG: Diagnosis M25.562 (ICD-10-CM) - [name] pain M25.552 (ICD-10-CM) - Left hip pain  Rationale for Evaluation and Treatment: Rehabilitation  THERAPY DIAG:  Pain in left hip  [name] pain, unspecified chronicity  Difficulty in walking, not elsewhere classified  ONSET DATE: L hip: several years ago, [name] pain 3 months ago  SUBJECTIVE:  SUBJECTIVE STATEMENT: L hip was worse after last session. The burning in L lateral leg is better. Had burning in L groin after last session which stopped today. L lateral hip pain: 1/10 during gait from waiting room to treatment room. No [name] pain, no [name] and L lateral leg burning currently.  The arch supports help [name].        PERTINENT HISTORY:  L hip and [name] pain. L hip has been bothering her for the past several years, gradual onset and progressively worsened. [name] pain started 3 months ago, sudden onset, unknown mechanism of injury,  swelled up and had to wear a [name] brace with patellar cutout which helped. Took ibuprofen for about a week which helped [name] pain. [name] pain has improved since 3 months. Started using a SPC on R side last month because of her L hip pain. Had R THA 2018. Also has arthritis L hip (bone  on bone), also has [name] arthritis.   No latex allergies  HTN controlled per pt.    PAIN:  Are you having pain? Yes: NPRS scale: 1/10 Pain location: L hip Pain description: burning, stiff, sharp Aggravating factors: L hip flexion, abduction, ER; walking, first thing in the morning, stair negotiation, standing (feels an ache), standing up from sitting Relieving factors: using her SPC, as the day goes on.     Are you having pain? Yes: NPRS scale: 1/10 Pain location: [name] Pain description: burning, gives way, aching Aggravating factors: stair negotiation, walking, pressure, pushing with her L LE to push desk chair back, standing up from sitting,  Relieving factors: [name] brace, ibuprofen     PRECAUTIONS: None  RED FLAGS: Bowel or bladder incontinence: No and Cauda equina syndrome: No   WEIGHT BEARING RESTRICTIONS: No  FALLS:  Has patient fallen in last 6 months? No  LIVING ENVIRONMENT: Lives with: lives with their spouse Lives in: House/apartment Stairs: Yes: External: 5 steps; on right going up Has following equipment at home: Single point cane, Walker - 2 wheeled, and shower chair  OCCUPATION: retired   PLOF: Independent  PATIENT GOALS: be able to get around without hurting. Avoid L hip surgery if possible. Be able to do her regular stuff without pain.   NEXT MD VISIT: April 25, 2023  OBJECTIVE:  Note: Objective measures were completed at Evaluation unless otherwise noted.    PATIENT SURVEYS:  LEFS 38/80 (03/22/2023)  COGNITION: Overall cognitive status: Within functional limits for tasks assessed     SENSATION: WFL  MUSCLE LENGTH:   POSTURE: forward neck, L cervical side bend, upper thoracic kyphosis, B protracted shoulders, L shoulder lower, L trunk side bend, L iliac crest, greater trochanter, [name] lower than R, B foot pronation, L lumbar side bend around L4/5 area with movement preference around that area.   Decreased L hip pain in standing with  L foot arch supported.     PALPATION: TTP L greater trochanter, [name] (tibial and femoral surfaces), medial and lateral L patella, L posterior hip    LUMBAR ROM:   AROM eval  Flexion WFL  Extension Limited with L lateral hip pain (along the L5 dermatome)  Right lateral flexion Limited with L lateral hip and thigh pain.   Left lateral flexion limited  Right rotation full  Left rotation WFL with reproduction of L L5 dermatome burning.    (Blank rows = not tested)  LOWER EXTREMITY ROM:     Passive  Right eval  Left eval  Hip flexion    Hip extension    Hip abduction    Hip adduction    Hip internal rotation 20 -2  Hip external rotation    [name] flexion    [name] extension    Ankle dorsiflexion    Ankle plantarflexion    Ankle inversion    Ankle eversion     (Blank rows = not tested)  LOWER EXTREMITY MMT:    MMT Right eval Left eval  Hip flexion 4 4 with L posterior hip joint pain  Hip extension (seated manually resisted) 4 4-  Hip abduction (seated manually resisted) 4 4-  Hip adduction    Hip internal rotation    Hip external rotation    [name] flexion 4+ 4  [name] extension 5 5  Ankle dorsiflexion    Ankle plantarflexion    Ankle inversion    Ankle eversion     (Blank rows = not tested)  LUMBAR SPECIAL TESTS:  (+) repeated flexion test with reproduction of L posterior hip and proximal thigh pain (+) Slump (decreased [name] pain with upright posture)    FUNCTIONAL TESTS:    GAIT: Distance walked: 30 ft Assistive device utilized: Single point cane Level of assistance: Modified independence Comments: antalgic, decreased stance L LE, SPC on R   TREATMENT DATE: 04/28/2023                                                                                                                               Neuromuscular re-education Sitting with upright posture in neutral with transversus abdominis and glute max muscle activation   R trunk side bend isometrics 10x2  with 5 second holds   L lateral leg burning, eases with rest   B scapular retraction to promote thoracic extension to decrease lumbar extension stress 10x5 seconds for 3 sets  Seated R trunk side bend to stretch the L 10x10 seconds  Standing B shoulder extension with B scapular retraction yellow band 10x5 seconds   TTP L greater trochanter  Hooklying   Clamshell red band 10x3   No greater than a 3/10 ache/pain   Decreased L hip ache during gait.    Improved technique, movement at target joints, use of target muscles after mod verbal, visual, tactile cues.            PATIENT EDUCATION:  Education details: there-ex, HEP Person educated: Patient Education method: Explanation, Demonstration, Tactile cues, Verbal cues, and Handouts Education comprehension: verbalized understanding and returned demonstration  HOME EXERCISE PROGRAM: Access Code: [email or web address] URL: [email or web address] Date: 04/20/2023 Prepared by: Loralyn Freshwater  Exercises - Seated Piriformis Stretch  - 3 x daily - 7 x weekly - 1 sets - 3 reps - 30 seconds hold  Put on hold on 04/25/2023 secondary to L lateral leg burning  - Seated Thoracic Lumbar Extension  - 1 x daily - 7 x weekly - 3 sets - 10 reps - 5  seconds hold - Seated Sidebending  - 3 x daily - 7 x weekly - 3 sets - 10 reps - 5 seconds hold - Supine Posterior Pelvic Tilt  - 1 x daily - 7 x weekly - 3 sets - 10 reps - 5 seconds hold - Hooklying Clamshell with Resistance  - 1 x daily - 7 x weekly - 3 sets - 10 reps  ASSESSMENT:  CLINICAL IMPRESSION:    Worked on improving thoracic extension, decreasing L lumbar side bend posture, improving trunk and glute strength to decrease stress to low back. TTP L greater trochanter, therefore performed supine clamshell resisted, no greater than 3/10 ache/pain to provide proper stress for glute med tendon healing. No L lateral hip pain reported during gait from treatment room to waiting room afterwards.  Gave aforementioned exercise as part of her HEP. Pt will benefit from continued skilled physical therapy services to decrease pain, improve strength and function.         OBJECTIVE IMPAIRMENTS: Abnormal gait, difficulty walking, decreased ROM, decreased strength, improper body mechanics, postural dysfunction, and pain.   ACTIVITY LIMITATIONS: carrying, lifting, bending, standing, squatting, stairs, transfers, toileting, and locomotion level  PARTICIPATION LIMITATIONS:   PERSONAL FACTORS: Age, Fitness, Past/current experiences, Time since onset of injury/illness/exacerbation, and 3+ comorbidities: Aortic valve insufficiency, asthma, diabetes, HTN, osteoarthritis  are also affecting patient's functional outcome.   REHAB POTENTIAL: Fair    CLINICAL DECISION MAKING: Evolving/moderate complexity worsening L hip pain per pt.  EVALUATION COMPLEXITY: Moderate   GOALS: Goals reviewed with patient? Yes  SHORT TERM GOALS: Target date: 04/01/2023  Pt will be independent with her initial HEP to decrease pain, improve strength, function, and ability to perform standing and walking tasks more comfortably for her L hip and [name].  Baseline: Pt has not yet started her initial HEP (03/22/2023) Goal status: INITIAL   LONG TERM GOALS: Target date: 05/20/2023  Pt will have a decrease in L hip pain to 3/10 or less at worst to promote ability to perform standing tasks, stair negotiation, and ambulate more comfortably.  Baseline: 6/10 at worst for the past 3 months (03/22/2023) Goal status: INITIAL  2.  Pt will have a decrease in [name] pain to 2/10 or less at worst to promote ability to ambulate, negotiate stairs, and perform standing tasks more comfortably.  Baseline: 8/10 at worst for the past 3 months (03/22/2023) Goal status: INITIAL  3.  Pt will improve B hip extension and abduction strength by at least 1/2 MMT grade to promote ability to ambulate, negotiate stairs, and perform standing tasks more  comfortably.  Baseline:  MMT Right eval Left eval  Hip extension (seated manually resisted) 4 4-  Hip abduction (seated manually resisted) 4 4-  (03/22/2023)  Goal status: INITIAL  4.  Pt will improve her LEFS score by at least 10 points as a demonstration of improved function.  Baseline: LEFS score 38 (03/22/2023) Goal status: INITIAL    PLAN:  PT FREQUENCY: 1-2x/week  PT DURATION: 8 weeks  PLANNED INTERVENTIONS: 97110-Therapeutic exercises, 97530- Therapeutic activity, 97112- Neuromuscular re-education, 97535- Self Care, 63875- Manual therapy, [phone]- Gait training, [phone]- Aquatic Therapy, [id number]- Electrical stimulation (unattended), [id number]- Traction (mechanical), [id number]- Ionotophoresis 4mg /ml Dexamethasone, Patient/Family education, Balance training, Stair training, Dry Needling, Joint mobilization, Spinal mobilization, and Vestibular training.  PLAN FOR NEXT SESSION: posture, thoracic extension, trunk and glute strengthening, lumbopelvic and femoral control, manual techniques, modalities PRN   Latrina Guttman, PT, DPT 04/28/2023, 11:35 AM

## 2023-05-02 ENCOUNTER — Ambulatory Visit: Payer: Medicare Other

## 2023-05-02 DIAGNOSIS — M25562 Pain in left knee: Secondary | ICD-10-CM

## 2023-05-02 DIAGNOSIS — R262 Difficulty in walking, not elsewhere classified: Secondary | ICD-10-CM | POA: Diagnosis not present

## 2023-05-02 DIAGNOSIS — M25552 Pain in left hip: Secondary | ICD-10-CM

## 2023-05-02 NOTE — Therapy (Signed)
 OUTPATIENT PHYSICAL THERAPY EVALUATION   Patient Name: Paula [name] MRN: [id number] DOB:[birth year], 73 y.o., female Today's Date: 05/02/2023  END OF SESSION:  PT End of Session - 05/02/23 0949     Visit Number 6    Number of Visits 17    Date for PT Re-Evaluation 05/20/23    PT Start Time 0949    PT Stop Time 1029    PT Time Calculation (min) 40 min    Activity Tolerance Patient tolerated treatment well    Behavior During Therapy [facility] for tasks assessed/performed                  Past Medical History:  Diagnosis Date   Aortic valve insufficiency    Asthma    Diabetes (HCC)    type II   Family history of heart disease    mother, son, and grandparent   GERD (gastroesophageal reflux disease)    Hiatal hernia    History of small bowel obstruction    HTN (hypertension)    Hyperlipidemia    Osteoarthritis    Palpitations    Paresthesias    left-sided   Thyroid nodule    Varicose vein    Past Surgical History:  Procedure Laterality Date   ABDOMINAL HYSTERECTOMY  02/22/2001   BREAST BIOPSY Right 04/07/2020   benign   CATARACT EXTRACTION W/PHACO Right 02/03/2022   Procedure: CATARACT EXTRACTION PHACO AND INTRAOCULAR LENS PLACEMENT (IOC) RIGHT DIABETIC 23.48 02:14.4;  Surgeon: Lockie Mola, MD;  Location: [facility];  Service: Ophthalmology;  Laterality: Right;   CATARACT EXTRACTION W/PHACO Left 03/31/2022   Procedure: CATARACT EXTRACTION PHACO AND INTRAOCULAR LENS PLACEMENT (IOC) LEFT DIABETIC 13.16 01:04.0;  Surgeon: Lockie Mola, MD;  Location: [facility];  Service: Ophthalmology;  Laterality: Left;  Diabetic   COLON SURGERY  02/23/2003   colon blockage due to adhesions   GALLBLADDER SURGERY  02/23/1972   HERNIA REPAIR  02/22/2001   incisional   TOTAL HIP ARTHROPLASTY Right 11/25/2016   Procedure: RIGHT TOTAL HIP ARTHROPLASTY ANTERIOR APPROACH;  Surgeon: Samson Frederic, MD;  Location: [facility];  Service: Orthopedics;   Laterality: Right;  NEEDS RNFA   TUBAL LIGATION  02/23/1988   UMBILICAL HERNIA REPAIR  02/23/1995   Patient Active Problem List   Diagnosis Date Noted   Coronary artery calcification 12/09/2022   Osteoarthritis of right hip 11/25/2016   Essential hypertension 08/01/2013   Hyperlipidemia 08/01/2013   Diabetes (HCC) 08/01/2013   Sleep apnea 08/01/2013   Morbid obesity (HCC) 08/01/2013   Palpitations 08/01/2013   Paresthesias 08/01/2013    PCP: Merri Brunette, MD   REFERRING PROVIDER: Anson Oregon, PA-C  REFERRING DIAG: Diagnosis M25.562 (ICD-10-CM) - Left knee pain M25.552 (ICD-10-CM) - Left hip pain  Rationale for Evaluation and Treatment: Rehabilitation  THERAPY DIAG:  Pain in left hip  Left knee pain, unspecified chronicity  Difficulty in walking, not elsewhere classified  ONSET DATE: L hip: several years ago, L knee pain 3 months ago  SUBJECTIVE:  SUBJECTIVE STATEMENT: L hip is some what better. No L hip pain during gait from waiting room to treatment room. Has 1-/10 L hip ache currently. Has slight burning L knee (1/10 currently)    PERTINENT HISTORY:  L hip and knee pain. L hip has been bothering her for the past several years, gradual onset and progressively worsened. L knee pain started 3 months ago, sudden onset, unknown mechanism of injury,  swelled up and had to wear a knee brace with patellar cutout which helped. Took ibuprofen for about a week which helped the knee pain. L knee pain has improved since 3 months. Started using a SPC on R side last month because of her L hip pain. Had R THA 2018. Also has arthritis L hip (bone on bone), also has L knee arthritis.   No latex allergies  HTN controlled per pt.    PAIN:  Are you having pain? Yes: NPRS scale: 1/10 Pain  location: L hip Pain description: burning, stiff, sharp Aggravating factors: L hip flexion, abduction, ER; walking, first thing in the morning, stair negotiation, standing (feels an ache), standing up from sitting Relieving factors: using her SPC, as the day goes on.     Are you having pain? Yes: NPRS scale: 1/10 Pain location: L knee Pain description: burning, gives way, aching Aggravating factors: stair negotiation, walking, pressure, pushing with her L LE to push desk chair back, standing up from sitting,  Relieving factors: knee brace, ibuprofen     PRECAUTIONS: None  RED FLAGS: Bowel or bladder incontinence: No and Cauda equina syndrome: No   WEIGHT BEARING RESTRICTIONS: No  FALLS:  Has patient fallen in last 6 months? No  LIVING ENVIRONMENT: Lives with: lives with their spouse Lives in: House/apartment Stairs: Yes: External: 5 steps; on right going up Has following equipment at home: Single point cane, Walker - 2 wheeled, and shower chair  OCCUPATION: retired   PLOF: Independent  PATIENT GOALS: be able to get around without hurting. Avoid L hip surgery if possible. Be able to do her regular stuff without pain.   NEXT MD VISIT: April 25, 2023  OBJECTIVE:  Note: Objective measures were completed at Evaluation unless otherwise noted.    PATIENT SURVEYS:  LEFS 38/80 (03/22/2023)  COGNITION: Overall cognitive status: Within functional limits for tasks assessed     SENSATION: WFL  MUSCLE LENGTH:   POSTURE: forward neck, L cervical side bend, upper thoracic kyphosis, B protracted shoulders, L shoulder lower, L trunk side bend, L iliac crest, greater trochanter, knee lower than R, B foot pronation, L lumbar side bend around L4/5 area with movement preference around that area.   Decreased L hip pain in standing with L foot arch supported.     PALPATION: TTP L greater trochanter, L knee (tibial and femoral surfaces), medial and lateral L patella, L posterior  hip    LUMBAR ROM:   AROM eval  Flexion WFL  Extension Limited with L lateral hip pain (along the L5 dermatome)  Right lateral flexion Limited with L lateral hip and thigh pain.   Left lateral flexion limited  Right rotation full  Left rotation WFL with reproduction of L L5 dermatome burning.    (Blank rows = not tested)  LOWER EXTREMITY ROM:     Passive  Right eval Left eval  Hip flexion    Hip extension    Hip abduction    Hip adduction    Hip internal rotation 20 -2  Hip external rotation  Knee flexion    Knee extension    Ankle dorsiflexion    Ankle plantarflexion    Ankle inversion    Ankle eversion     (Blank rows = not tested)  LOWER EXTREMITY MMT:    MMT Right eval Left eval  Hip flexion 4 4 with L posterior hip joint pain  Hip extension (seated manually resisted) 4 4-  Hip abduction (seated manually resisted) 4 4-  Hip adduction    Hip internal rotation    Hip external rotation    Knee flexion 4+ 4  Knee extension 5 5  Ankle dorsiflexion    Ankle plantarflexion    Ankle inversion    Ankle eversion     (Blank rows = not tested)  LUMBAR SPECIAL TESTS:  (+) repeated flexion test with reproduction of L posterior hip and proximal thigh pain (+) Slump (decreased L knee pain with upright posture)    FUNCTIONAL TESTS:    GAIT: Distance walked: 30 ft Assistive device utilized: Single point cane Level of assistance: Modified independence Comments: antalgic, decreased stance L LE, SPC on R   TREATMENT DATE: 05/02/2023                                                                                                                                 Therapeutic exercise Standing with B UE assist   Hip abduction    L 10x2 (L hip pain no greater than 3/10).    Then with eccentric return 10x, then 5x   Side stepping 25 ft to the R and 25 ft to the L for 2 sets  Standing, bent over onto elevated low mat table  Hip extension    R 10x3   L  10x3  Seated L hip ER 10x5 seconds for 3 sets  Sated L knee flexion targeting medial hamstrings red band 10x3  To promote better tibiofemoral mechanics  Seated hip adduction 10x3 with 5 second holds  Standing static mini lunge onto first stair step with B UE assist 10x5 seconds    Improved exercise technique, movement at target joints, use of target muscles after mod verbal, visual, tactile cues.            PATIENT EDUCATION:  Education details: there-ex, HEP Person educated: Patient Education method: Explanation, Demonstration, Tactile cues, Verbal cues, and Handouts Education comprehension: verbalized understanding and returned demonstration  HOME EXERCISE PROGRAM: Access Code: [email or web address] URL: [email or web address] Date: 04/20/2023 Prepared by: Loralyn Freshwater  Exercises - Seated Piriformis Stretch  - 3 x daily - 7 x weekly - 1 sets - 3 reps - 30 seconds hold  Put on hold on 04/25/2023 secondary to L lateral leg burning  - Seated Thoracic Lumbar Extension  - 1 x daily - 7 x weekly - 3 sets - 10 reps - 5 seconds hold - Seated Sidebending  - 3 x daily - 7 x weekly - 3 sets - 10 reps -  5 seconds hold - Supine Posterior Pelvic Tilt  - 1 x daily - 7 x weekly - 3 sets - 10 reps - 5 seconds hold - Hooklying Clamshell with Resistance  - 1 x daily - 7 x weekly - 3 sets - 10 reps  red band  ASSESSMENT:  CLINICAL IMPRESSION:   Good response with decreased L hip pain from previous session based on subjective reports. Continued providing proper stress to L glute med muscle to promote healing. Pt tolerated session well without aggravation of symptoms.  Pt will benefit from continued skilled physical therapy services to decrease pain, improve strength and function.         OBJECTIVE IMPAIRMENTS: Abnormal gait, difficulty walking, decreased ROM, decreased strength, improper body mechanics, postural dysfunction, and pain.   ACTIVITY LIMITATIONS: carrying, lifting,  bending, standing, squatting, stairs, transfers, toileting, and locomotion level  PARTICIPATION LIMITATIONS:   PERSONAL FACTORS: Age, Fitness, Past/current experiences, Time since onset of injury/illness/exacerbation, and 3+ comorbidities: Aortic valve insufficiency, asthma, diabetes, HTN, osteoarthritis  are also affecting patient's functional outcome.   REHAB POTENTIAL: Fair    CLINICAL DECISION MAKING: Evolving/moderate complexity worsening L hip pain per pt.  EVALUATION COMPLEXITY: Moderate   GOALS: Goals reviewed with patient? Yes  SHORT TERM GOALS: Target date: 04/01/2023  Pt will be independent with her initial HEP to decrease pain, improve strength, function, and ability to perform standing and walking tasks more comfortably for her L hip and knee.  Baseline: Pt has not yet started her initial HEP (03/22/2023) Goal status: INITIAL   LONG TERM GOALS: Target date: 05/20/2023  Pt will have a decrease in L hip pain to 3/10 or less at worst to promote ability to perform standing tasks, stair negotiation, and ambulate more comfortably.  Baseline: 6/10 at worst for the past 3 months (03/22/2023) Goal status: INITIAL  2.  Pt will have a decrease in L knee pain to 2/10 or less at worst to promote ability to ambulate, negotiate stairs, and perform standing tasks more comfortably.  Baseline: 8/10 at worst for the past 3 months (03/22/2023) Goal status: INITIAL  3.  Pt will improve B hip extension and abduction strength by at least 1/2 MMT grade to promote ability to ambulate, negotiate stairs, and perform standing tasks more comfortably.  Baseline:  MMT Right eval Left eval  Hip extension (seated manually resisted) 4 4-  Hip abduction (seated manually resisted) 4 4-  (03/22/2023)  Goal status: INITIAL  4.  Pt will improve her LEFS score by at least 10 points as a demonstration of improved function.  Baseline: LEFS score 38 (03/22/2023) Goal status: INITIAL    PLAN:  PT  FREQUENCY: 1-2x/week  PT DURATION: 8 weeks  PLANNED INTERVENTIONS: 97110-Therapeutic exercises, 97530- Therapeutic activity, 97112- Neuromuscular re-education, 97535- Self Care, 16109- Manual therapy, [phone]- Gait training, [phone]- Aquatic Therapy, 97014- Electrical stimulation (unattended), [phone]- Traction (mechanical), [id number]- Ionotophoresis 4mg /ml Dexamethasone, Patient/Family education, Balance training, Stair training, Dry Needling, Joint mobilization, Spinal mobilization, and Vestibular training.  PLAN FOR NEXT SESSION: posture, thoracic extension, trunk and glute strengthening, lumbopelvic and femoral control, manual techniques, modalities PRN   Verma Grothaus, PT, DPT 05/02/2023, 12:24 PM

## 2023-05-05 ENCOUNTER — Ambulatory Visit: Payer: Medicare Other

## 2023-05-09 ENCOUNTER — Ambulatory Visit: Payer: Medicare Other

## 2023-05-09 DIAGNOSIS — M25552 Pain in left hip: Secondary | ICD-10-CM

## 2023-05-09 DIAGNOSIS — R262 Difficulty in walking, not elsewhere classified: Secondary | ICD-10-CM | POA: Diagnosis not present

## 2023-05-09 DIAGNOSIS — M25562 Pain in left knee: Secondary | ICD-10-CM | POA: Diagnosis not present

## 2023-05-09 NOTE — Therapy (Signed)
 OUTPATIENT PHYSICAL THERAPY TREATMENT And Discharge Summary   Patient Name: Paula [name] MRN: [id number] DOB:1950/12/10, 73 y.o., female Today's Date: 05/09/2023  END OF SESSION:  PT End of Session - 05/09/23 0950     Visit Number 7    Number of Visits 17    Date for PT Re-Evaluation 05/20/23    PT Start Time 0950    PT Stop Time 1029    PT Time Calculation (min) 39 min    Activity Tolerance Patient tolerated treatment well    Behavior During Therapy [facility] for tasks assessed/performed                   Past Medical History:  Diagnosis Date   Aortic valve insufficiency    Asthma    Diabetes (HCC)    type II   Family history of heart disease    mother, son, and grandparent   GERD (gastroesophageal reflux disease)    Hiatal hernia    History of small bowel obstruction    HTN (hypertension)    Hyperlipidemia    Osteoarthritis    Palpitations    Paresthesias    left-sided   Thyroid nodule    Varicose vein    Past Surgical History:  Procedure Laterality Date   ABDOMINAL HYSTERECTOMY  02/22/2001   BREAST BIOPSY Right 04/07/2020   benign   CATARACT EXTRACTION W/PHACO Right 02/03/2022   Procedure: CATARACT EXTRACTION PHACO AND INTRAOCULAR LENS PLACEMENT (IOC) RIGHT DIABETIC 23.48 02:14.4;  Surgeon: Lockie Mola, MD;  Location[facility];  Service: Ophthalmology;  Laterality: Right;   CATARACT EXTRACTION W/PHACO Left 03/31/2022   Procedure: CATARACT EXTRACTION PHACO AND INTRAOCULAR LENS PLACEMENT (IOC) LEFT DIABETIC 13.16 01:04.0;  Surgeon: Lockie Mola, MD;  Location: [facility];  Service: Ophthalmology;  Laterality: Left;  Diabetic   COLON SURGERY  02/23/2003   colon blockage due to adhesions   GALLBLADDER SURGERY  02/23/1972   HERNIA REPAIR  02/22/2001   incisional   TOTAL HIP ARTHROPLASTY Right 11/25/2016   Procedure: RIGHT TOTAL HIP ARTHROPLASTY ANTERIOR APPROACH;  Surgeon: Samson Frederic, MD;  Location: [facility];   Service: Orthopedics;  Laterality: Right;  NEEDS RNFA   TUBAL LIGATION  02/23/1988   UMBILICAL HERNIA REPAIR  02/23/1995   Patient Active Problem List   Diagnosis Date Noted   Coronary artery calcification 12/09/2022   Osteoarthritis of right hip 11/25/2016   Essential hypertension 08/01/2013   Hyperlipidemia 08/01/2013   Diabetes (HCC) 08/01/2013   Sleep apnea 08/01/2013   Morbid obesity (HCC) 08/01/2013   Palpitations 08/01/2013   Paresthesias 08/01/2013    PCP: Merri Brunette, MD   REFERRING PROVIDER: Anson Oregon, PA-C  REFERRING DIAG: Diagnosis M25.562 (ICD-10-CM) - Left knee pain M25.552 (ICD-10-CM) - Left hip pain  Rationale for Evaluation and Treatment: Rehabilitation  THERAPY DIAG:  Pain in left hip  Left knee pain, unspecified chronicity  Difficulty in walking, not elsewhere classified  ONSET DATE: L hip: several years ago, L knee pain 3 months ago  SUBJECTIVE:  SUBJECTIVE STATEMENT: L hip is doing pretty good overall. Seems to vary. Some days the L hip is good, some days it is not. Feels like the HEP are helping.        PERTINENT HISTORY:  L hip and knee pain. L hip has been bothering her for the past several years, gradual onset and progressively worsened. L knee pain started 3 months ago, sudden onset, unknown mechanism of injury,  swelled up and had to wear a knee brace with patellar cutout which helped. Took ibuprofen for about a week which helped the knee pain. L knee pain has improved since 3 months. Started using a SPC on R side last month because of her L hip pain. Had R THA 2018. Also has arthritis L hip (bone on bone), also has L knee arthritis.   No latex allergies  HTN controlled per pt.    PAIN:  Are you having pain? Yes: NPRS scale: 1/10 Pain  location: L hip Pain description: burning, stiff, sharp Aggravating factors: L hip flexion, abduction, ER; walking, first thing in the morning, stair negotiation, standing (feels an ache), standing up from sitting Relieving factors: using her SPC, as the day goes on.     Are you having pain? Yes: NPRS scale: 1/10 Pain location: L knee Pain description: burning, gives way, aching Aggravating factors: stair negotiation, walking, pressure, pushing with her L LE to push desk chair back, standing up from sitting,  Relieving factors: knee brace, ibuprofen     PRECAUTIONS: None  RED FLAGS: Bowel or bladder incontinence: No and Cauda equina syndrome: No   WEIGHT BEARING RESTRICTIONS: No  FALLS:  Has patient fallen in last 6 months? No  LIVING ENVIRONMENT: Lives with: lives with their spouse Lives in: House/apartment Stairs: Yes: External: 5 steps; on right going up Has following equipment at home: Single point cane, Walker - 2 wheeled, and shower chair  OCCUPATION: retired   PLOF: Independent  PATIENT GOALS: be able to get around without hurting. Avoid L hip surgery if possible. Be able to do her regular stuff without pain.   NEXT MD VISIT: April 25, 2023  OBJECTIVE:  Note: Objective measures were completed at Evaluation unless otherwise noted.    PATIENT SURVEYS:  LEFS 38/80 (03/22/2023)  COGNITION: Overall cognitive status: Within functional limits for tasks assessed     SENSATION: WFL  MUSCLE LENGTH:   POSTURE: forward neck, L cervical side bend, upper thoracic kyphosis, B protracted shoulders, L shoulder lower, L trunk side bend, L iliac crest, greater trochanter, knee lower than R, B foot pronation, L lumbar side bend around L4/5 area with movement preference around that area.   Decreased L hip pain in standing with L foot arch supported.     PALPATION: TTP L greater trochanter, L knee (tibial and femoral surfaces), medial and lateral L patella, L posterior  hip    LUMBAR ROM:   AROM eval  Flexion WFL  Extension Limited with L lateral hip pain (along the L5 dermatome)  Right lateral flexion Limited with L lateral hip and thigh pain.   Left lateral flexion limited  Right rotation full  Left rotation WFL with reproduction of L L5 dermatome burning.    (Blank rows = not tested)  LOWER EXTREMITY ROM:     Passive  Right eval Left eval  Hip flexion    Hip extension    Hip abduction    Hip adduction    Hip internal rotation 20 -2  Hip external  rotation    Knee flexion    Knee extension    Ankle dorsiflexion    Ankle plantarflexion    Ankle inversion    Ankle eversion     (Blank rows = not tested)  LOWER EXTREMITY MMT:    MMT Right eval Left eval  Hip flexion 4 4 with L posterior hip joint pain  Hip extension (seated manually resisted) 4 4-  Hip abduction (seated manually resisted) 4 4-  Hip adduction    Hip internal rotation    Hip external rotation    Knee flexion 4+ 4  Knee extension 5 5  Ankle dorsiflexion    Ankle plantarflexion    Ankle inversion    Ankle eversion     (Blank rows = not tested)  LUMBAR SPECIAL TESTS:  (+) repeated flexion test with reproduction of L posterior hip and proximal thigh pain (+) Slump (decreased L knee pain with upright posture)    FUNCTIONAL TESTS:    GAIT: Distance walked: 30 ft Assistive device utilized: Single point cane Level of assistance: Modified independence Comments: antalgic, decreased stance L LE, SPC on R   TREATMENT DATE: 05/09/2023                                                                                                                                 Therapeutic exercise  Seated manually resisted hip extension and hip abduction 1x each way for each LE  Reviewed progress/current status with PT towards goals   Reviewed POC: pt can graduate PT Today with pt continuing progress with her HEP secondary to very good progress with PT towards goals.    Standing with B UE assist   Hip abduction with eccentric return   L 10x3 (L hip pain no greater than 3/10).      Side stepping 30 ft to the R and 30 ft to the L for 2 sets   Standing, bent over onto elevated low mat table  Hip extension    R 10x3   L 10x3  Seated L hip ER 10x5 seconds for 3 sets  Seated hip adduction 10x3 with 5 second holds   Standing static mini lunge onto first stair step with B UE assist 3x5 seconds       Improved exercise technique, movement at target joints, use of target muscles after mod verbal, visual, tactile cues.            PATIENT EDUCATION:  Education details: there-ex, HEP Person educated: Patient Education method: Explanation, Demonstration, Tactile cues, Verbal cues, and Handouts Education comprehension: verbalized understanding and returned demonstration  HOME EXERCISE PROGRAM: Access Code: [email or web address] URL: [email or web address] Date: 04/20/2023 Prepared by: Loralyn Freshwater  Exercises - Seated Piriformis Stretch  - 3 x daily - 7 x weekly - 1 sets - 3 reps - 30 seconds hold  Put on hold on 04/25/2023 secondary to L lateral leg burning  - Seated Thoracic Lumbar Extension  -  1 x daily - 7 x weekly - 3 sets - 10 reps - 5 seconds hold - Seated Sidebending  - 3 x daily - 7 x weekly - 3 sets - 10 reps - 5 seconds hold - Supine Posterior Pelvic Tilt  - 1 x daily - 7 x weekly - 3 sets - 10 reps - 5 seconds hold - Hooklying Clamshell with Resistance  - 1 x daily - 7 x weekly - 3 sets - 10 reps  red band (upgraded to a green band on 05/09/2023)  - Standing Hip Abduction with Counter Support  - 1 x daily - 7 x weekly - 2-3 sets - 10 reps  - Sideways Walking  - 2 x daily - 7 x weekly - 1 sets - 2 reps      ASSESSMENT:  CLINICAL IMPRESSION:    Pt demonstrates decreased L hip and knee pain, as well as improved B hip strength since initial evaluation. Pt has made very good progress with PT towards goals and demonstrates  independence with her exercises at home. Skilled physical therapy services discharged with pt continuing her her HEP.          OBJECTIVE IMPAIRMENTS: Abnormal gait, difficulty walking, decreased ROM, decreased strength, improper body mechanics, postural dysfunction, and pain.   ACTIVITY LIMITATIONS: carrying, lifting, bending, standing, squatting, stairs, transfers, toileting, and locomotion level  PARTICIPATION LIMITATIONS:   PERSONAL FACTORS: Age, Fitness, Past/current experiences, Time since onset of injury/illness/exacerbation, and 3+ comorbidities: Aortic valve insufficiency, asthma, diabetes, HTN, osteoarthritis  are also affecting patient's functional outcome.   REHAB POTENTIAL: Fair    CLINICAL DECISION MAKING: Evolving/moderate complexity worsening L hip pain per pt.  EVALUATION COMPLEXITY: Moderate   GOALS: Goals reviewed with patient? Yes  SHORT TERM GOALS: Target date: 04/01/2023  Pt will be independent with her initial HEP to decrease pain, improve strength, function, and ability to perform standing and walking tasks more comfortably for her L hip and knee.  Baseline: Pt has not yet started her initial HEP (03/22/2023); No questions, able to do them at home (05/09/2023) Goal status: MET   LONG TERM GOALS: Target date: 05/20/2023  Pt will have a decrease in L hip pain to 3/10 or less at worst to promote ability to perform standing tasks, stair negotiation, and ambulate more comfortably.  Baseline: 6/10 at worst for the past 3 months (03/22/2023); 3/10 at worst for the past 7 days (05/09/2023) Goal status: MET  2.  Pt will have a decrease in L knee pain to 2/10 or less at worst to promote ability to ambulate, negotiate stairs, and perform standing tasks more comfortably.  Baseline: 8/10 at worst for the past 3 months (03/22/2023); 2/10 at worst for the past 7 days (05/09/2023) Goal status: MET  3.  Pt will improve B hip extension and abduction strength by at least 1/2  MMT grade to promote ability to ambulate, negotiate stairs, and perform standing tasks more comfortably.  Baseline:  MMT Right eval Left eval R (05/09/2023) L (05/09/2023)  Hip extension (seated manually resisted) 4 4- 4+ 4+  Hip abduction (seated manually resisted) 4 4- 4+ 4+  (03/22/2023)  Goal status: MET  4.  Pt will improve her LEFS score by at least 10 points as a demonstration of improved function.  Baseline: LEFS score 38 (03/22/2023); 37 (05/09/2023) Goal status: Ongoing    PLAN:  PT FREQUENCY: 1-2x/week  PT DURATION: 8 weeks  PLANNED INTERVENTIONS: 97110-Therapeutic exercises, 97530- Therapeutic activity, 97112- Neuromuscular re-education,  16109- Self Care, 60454- Manual therapy, [id number]- Gait training, [id number]- Aquatic Therapy, [phone]- Electrical stimulation (unattended), [id number]- Traction (mechanical), [id number]- Ionotophoresis 4mg /ml Dexamethasone, Patient/Family education, Balance training, Stair training, Dry Needling, Joint mobilization, Spinal mobilization, and Vestibular training.  PLAN FOR NEXT SESSION: posture, thoracic extension, trunk and glute strengthening, lumbopelvic and femoral control, manual techniques, modalities PRN  Thank you for your referral.   Rafe Mackowski, PT, DPT 05/09/2023, 12:39 PM

## 2023-05-12 ENCOUNTER — Ambulatory Visit: Payer: Medicare Other

## 2023-05-16 ENCOUNTER — Ambulatory Visit: Payer: Medicare Other

## 2023-06-14 DIAGNOSIS — E039 Hypothyroidism, unspecified: Secondary | ICD-10-CM | POA: Diagnosis not present

## 2023-06-14 DIAGNOSIS — I10 Essential (primary) hypertension: Secondary | ICD-10-CM | POA: Diagnosis not present

## 2023-06-14 DIAGNOSIS — E041 Nontoxic single thyroid nodule: Secondary | ICD-10-CM | POA: Diagnosis not present

## 2023-07-13 ENCOUNTER — Emergency Department
Admission: EM | Admit: 2023-07-13 | Discharge: 2023-07-13 | Disposition: A | Discharge status: Home / Self Care | Attending: Emergency Medicine | Admitting: Emergency Medicine

## 2023-07-13 ENCOUNTER — Encounter: Payer: Self-pay | Admitting: Emergency Medicine

## 2023-07-13 ENCOUNTER — Emergency Department

## 2023-07-13 ENCOUNTER — Other Ambulatory Visit: Payer: Self-pay

## 2023-07-13 DIAGNOSIS — R2242 Localized swelling, mass and lump, left lower limb: Secondary | ICD-10-CM | POA: Diagnosis not present

## 2023-07-13 DIAGNOSIS — M79661 Pain in right lower leg: Secondary | ICD-10-CM | POA: Diagnosis present

## 2023-07-13 DIAGNOSIS — Z85820 Personal history of malignant melanoma of skin: Secondary | ICD-10-CM | POA: Insufficient documentation

## 2023-07-13 DIAGNOSIS — E119 Type 2 diabetes mellitus without complications: Secondary | ICD-10-CM | POA: Diagnosis not present

## 2023-07-13 DIAGNOSIS — I10 Essential (primary) hypertension: Secondary | ICD-10-CM | POA: Diagnosis not present

## 2023-07-13 DIAGNOSIS — M7989 Other specified soft tissue disorders: Secondary | ICD-10-CM | POA: Insufficient documentation

## 2023-07-13 DIAGNOSIS — E039 Hypothyroidism, unspecified: Secondary | ICD-10-CM | POA: Diagnosis not present

## 2023-07-13 MED ORDER — CYCLOBENZAPRINE HCL 10 MG PO TABS: 10.0000 mg | ORAL_TABLET | Freq: Every evening | ORAL | 0 refills | Status: AC | PRN

## 2023-07-13 MED ORDER — ACETAMINOPHEN ER 650 MG PO TBCR: 650.0000 mg | EXTENDED_RELEASE_TABLET | Freq: Three times a day (TID) | ORAL | 0 refills | Status: AC | PRN

## 2023-07-13 NOTE — ED Triage Notes (Signed)
 Patient to ED via POV for right leg swelling. Ongoing x10 days. Sent from [facility] to r/o DVT.

## 2023-07-13 NOTE — Discharge Instructions (Addendum)
 You have been diagnosed with right calf muscle strain your ultrasound was negative for deep venous thrombosis.  Please take acetaminophen  1 tablet by mouth every 8 hours during the day.  You can take Flexeril 1 tablet by mouth at bedtime as needed.  You can apply warm compresses in your right leg, elevate your right leg.  Please come back to ED or go to your PCP if you have new symptoms or symptoms worsen.

## 2023-07-13 NOTE — ED Provider Notes (Signed)
 [facility] Provider Note    Event Date/Time   First MD Initiated Contact with Patient 07/13/23 1522     (approximate)   History   Leg Swelling    HPI  Paula [name] is a 73 y.o. female    with a past medical history of lumbar radiculopathy, left knee pain, left hip pain, osteoarthritis, malignant melanoma, DM type II, HTN, thyroid  nodule, hypothyroidism, who presents to the ED complaining of right calf pain. According to the patient, symptoms started 10 days ago, pain in the right calf. Patient was wearing a knee brace a couple of weeks ago for knee effusion due to osteoarthritis. Patient denies, erythema, recent traveling, trauma, smoking, or taking hormones. Patient was referred from [facility] to rule out DVT. Patient denies wheezing, chest pain, cough, abdominal pain, fever. Or urinary symptoms.       Physical Exam   Triage Vital Signs: ED Triage Vitals  Encounter Vitals Group     BP 07/13/23 1438 (!) 118/46     Systolic BP Percentile --      Diastolic BP Percentile --      Pulse Rate 07/13/23 1438 73     Resp 07/13/23 1438 17     Temp 07/13/23 1438 98.2 F (36.8 C)     Temp Source 07/13/23 1438 Oral     SpO2 07/13/23 1438 96 %     Weight 07/13/23 1437 174 lb (78.9 kg)     Height 07/13/23 1437 5' 2.5" (1.588 m)     Head Circumference --      Peak Flow --      Pain Score 07/13/23 1437 2     Pain Loc --      Pain Education --      Exclude from Growth Chart --     Most recent vital signs: Vitals:   07/13/23 1438  BP: (!) 118/46  Pulse: 73  Resp: 17  Temp: 98.2 F (36.8 C)  SpO2: 96%     Constitutional: Alert, NAD. Able to speak in complete sentences without cough or dyspnea  Eyes: Conjunctivae are normal.  Head: Atraumatic. Nose: No congestion/rhinnorhea. Mouth/Throat: Mucous membranes are moist.   Neck: Painless ROM. Supple. No JVD, nodes, thyromegaly  Cardiovascular:   Good peripheral circulation.RRR no murmurs,  gallops, rubs  Respiratory: Normal respiratory effort.  No retractions. Clear to auscultation bilaterally without wheezing or crackles  Gastrointestinal: Soft and nontender.  Musculoskeletal:  no deformity Right leg: Skin is intact, presence of varicose vein, no erythema, no warmth.  Tender to palpation on the medial superior  third of the calf.Sensation is intact, pulses present. Neurologic:  MAE spontaneously. No gross focal neurologic deficits are appreciated.  Skin:  Skin is warm, dry and intact. No rash noted. Psychiatric: Mood and affect are normal. Speech and behavior are normal.    ED Results / Procedures / Treatments   Labs (all labs ordered are listed, but only abnormal results are displayed) Labs Reviewed - No data to display   EKG     RADIOLOGY I independently reviewed and interpreted imaging and agree with radiologists findings.      PROCEDURES:  Critical Care performed:   Procedures   MEDICATIONS ORDERED IN ED: Medications - No data to display Clinical Course as of 07/13/23 1604  Wed Jul 13, 2023  1546 US  Venous Img Lower Unilateral Right No right lower extremity DVT.  [AE]    Clinical Course User Index [AE] Awilda Lennox, PA-C  IMPRESSION / MDM / ASSESSMENT AND PLAN / ED COURSE  I reviewed the triage vital signs and the nursing notes.  Differential diagnosis includes, but is not limited to, DVT, muscle strain, fracture, thrombophlebitis   Patient's presentation is most consistent with acute complicated illness / injury requiring diagnostic workup.  Patient's diagnosis is consistent with muscle strain secondary to use of knee brace. I independently reviewed and interpreted imaging and agree with radiologists findings ruling out DVT.Physical exam is reassuring,no presence of erythema, warmness.   I did review the patient's allergies and medications.The patient is in stable and satisfactory condition for discharge home  Patient will be discharged  home with prescriptions for acetaminophen  650 every 8 hours, flexeril at nights. Patient is to follow up with PCP  as needed or otherwise directed. Patient is given ED precautions to return to the ED for any worsening or new symptoms. Discussed plan of care with patient, answered all of patient's questions, Patient agreeable to plan of care. Advised patient to take medications according to the instructions on the label. Discussed possible side effects of new medications. Patient verbalized understanding.    FINAL CLINICAL IMPRESSION(S) / ED DIAGNOSES   Final diagnoses:  Left leg swelling     Rx / DC Orders   ED Discharge Orders          Ordered    cyclobenzaprine (FLEXERIL) 10 MG tablet  At bedtime PRN        07/13/23 1603    acetaminophen  (ACETAMINOPHEN  8 HOUR) 650 MG CR tablet  Every 8 hours PRN        07/13/23 1603             Note:  This document was prepared using Dragon voice recognition software and may include unintentional dictation errors.   Awilda Lennox, PA-C 07/13/23 1604    Kandee Orion, MD 07/13/23 2138

## 2023-07-26 DIAGNOSIS — M2041 Other hammer toe(s) (acquired), right foot: Secondary | ICD-10-CM | POA: Diagnosis not present

## 2023-07-26 DIAGNOSIS — E119 Type 2 diabetes mellitus without complications: Secondary | ICD-10-CM | POA: Diagnosis not present

## 2023-07-26 DIAGNOSIS — M2011 Hallux valgus (acquired), right foot: Secondary | ICD-10-CM | POA: Diagnosis not present

## 2023-08-23 DIAGNOSIS — Z86006 Personal history of melanoma in-situ: Secondary | ICD-10-CM | POA: Diagnosis not present

## 2023-08-23 DIAGNOSIS — D2262 Melanocytic nevi of left upper limb, including shoulder: Secondary | ICD-10-CM | POA: Diagnosis not present

## 2023-08-23 DIAGNOSIS — D2261 Melanocytic nevi of right upper limb, including shoulder: Secondary | ICD-10-CM | POA: Diagnosis not present

## 2023-08-23 DIAGNOSIS — Z85820 Personal history of malignant melanoma of skin: Secondary | ICD-10-CM | POA: Diagnosis not present

## 2023-08-23 DIAGNOSIS — D2272 Melanocytic nevi of left lower limb, including hip: Secondary | ICD-10-CM | POA: Diagnosis not present

## 2023-08-23 DIAGNOSIS — D2271 Melanocytic nevi of right lower limb, including hip: Secondary | ICD-10-CM | POA: Diagnosis not present

## 2023-09-20 DIAGNOSIS — E7800 Pure hypercholesterolemia, unspecified: Secondary | ICD-10-CM | POA: Diagnosis not present

## 2023-09-20 DIAGNOSIS — E039 Hypothyroidism, unspecified: Secondary | ICD-10-CM | POA: Diagnosis not present

## 2023-12-13 ENCOUNTER — Encounter: Payer: Self-pay | Admitting: *Deleted

## 2023-12-13 NOTE — Progress Notes (Signed)
 Paula [name]                                          MRN: [id number]   12/13/2023   The [facility] Quality Team Specialist reviewed this patient medical record for the purposes of chart review for care gap closure. The following were reviewed: chart review for care gap closure-glycemic status assessment and kidney health evaluation for diabetes:eGFR  and uACR.    [facility] Quality Team

## 2024-01-14 IMAGING — MG MM DIGITAL SCREENING BILAT W/ TOMO AND CAD
8 series · 8 of 24 positions shown · non-contrast
Comparison: Previous exam(s).

CLINICAL DATA: Screening.

EXAM:
DIGITAL SCREENING BILATERAL MAMMOGRAM WITH TOMOSYNTHESIS AND CAD
TECHNIQUE: Bilateral screening digital craniocaudal and mediolateral oblique
mammograms were obtained. Bilateral screening digital breast
tomosynthesis was performed. The images were evaluated with
computer-aided detection.

[L MLO synth-2D]
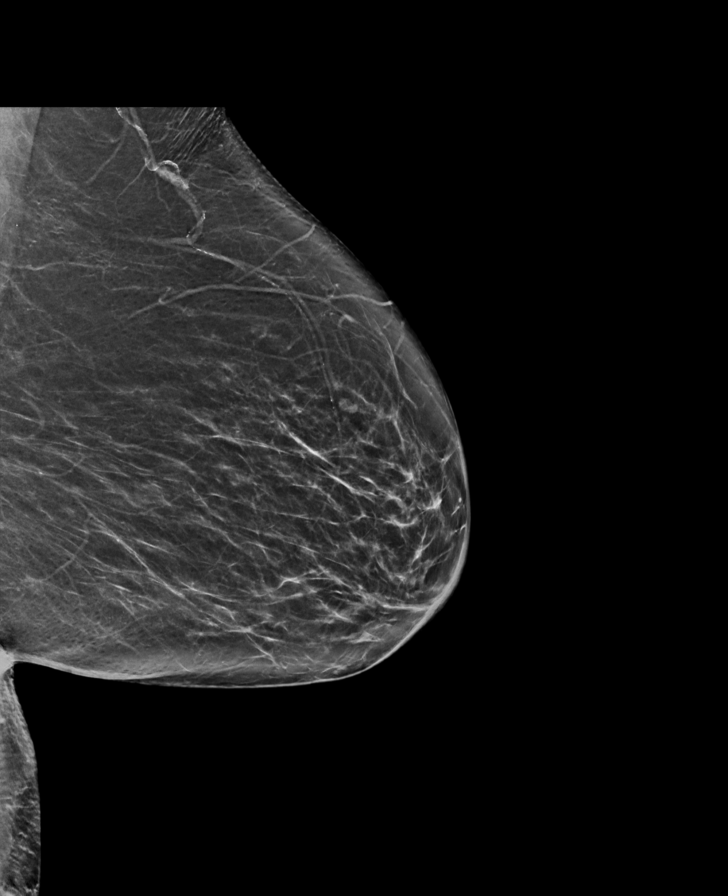

[R MLO synth-2D]
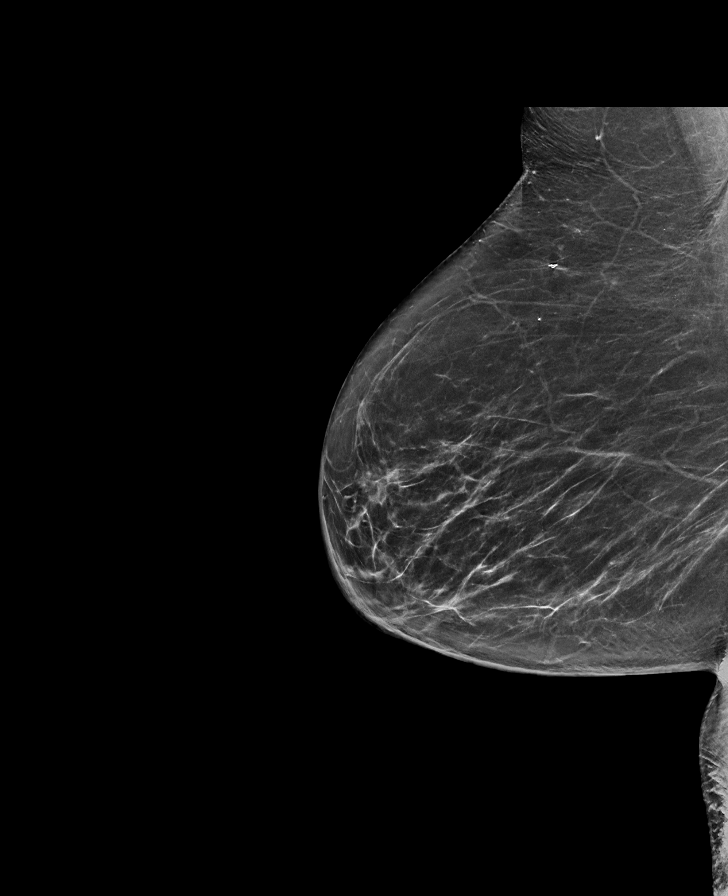

[L CC synth-2D]
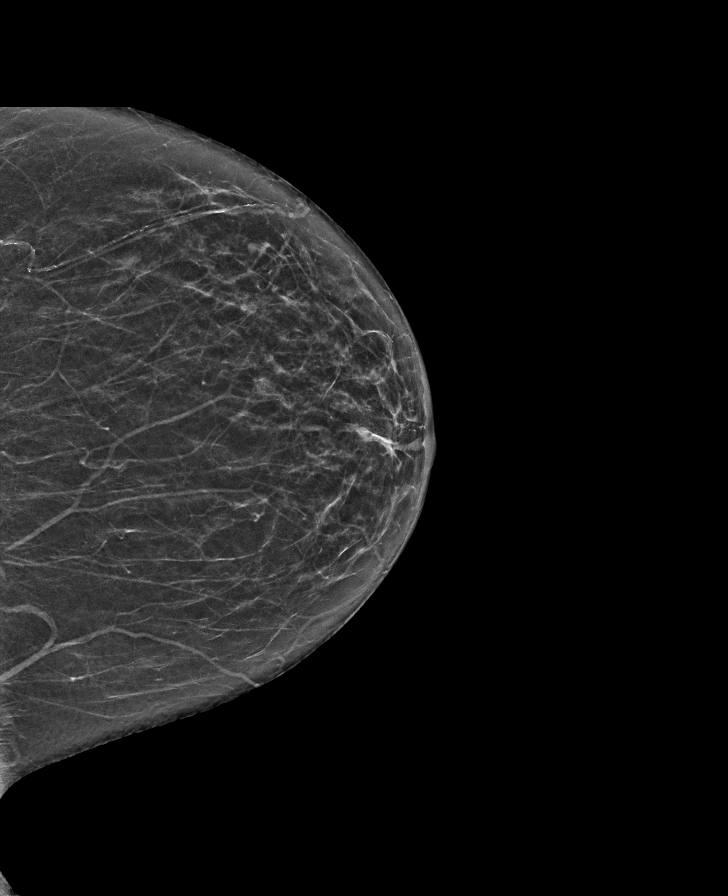

[R CC synth-2D]
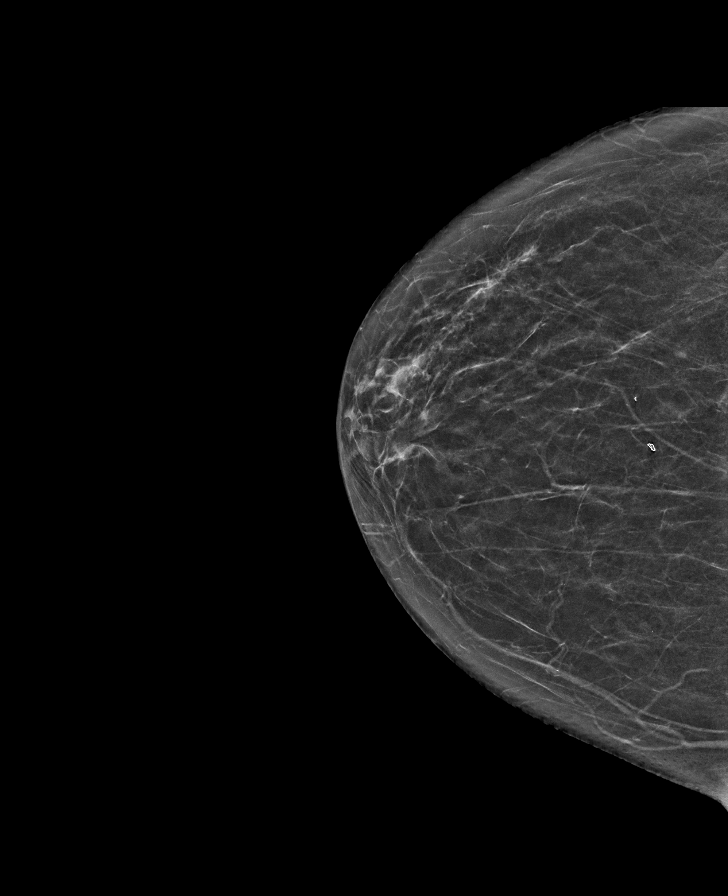

[L MLO tomo · tomo slice 35/69.0]
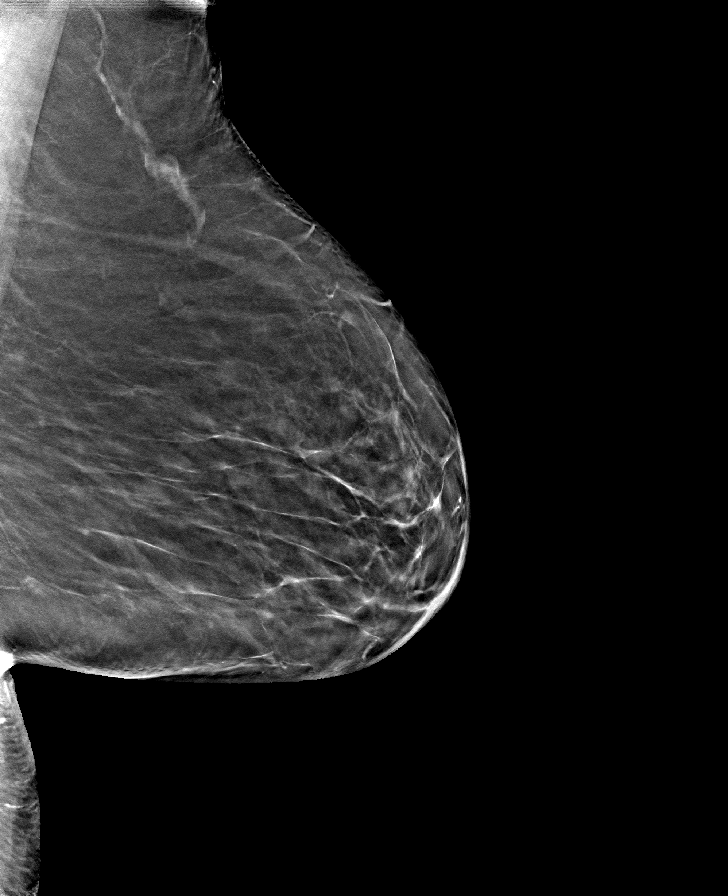

[L CC tomo · tomo slice 29/58.0]
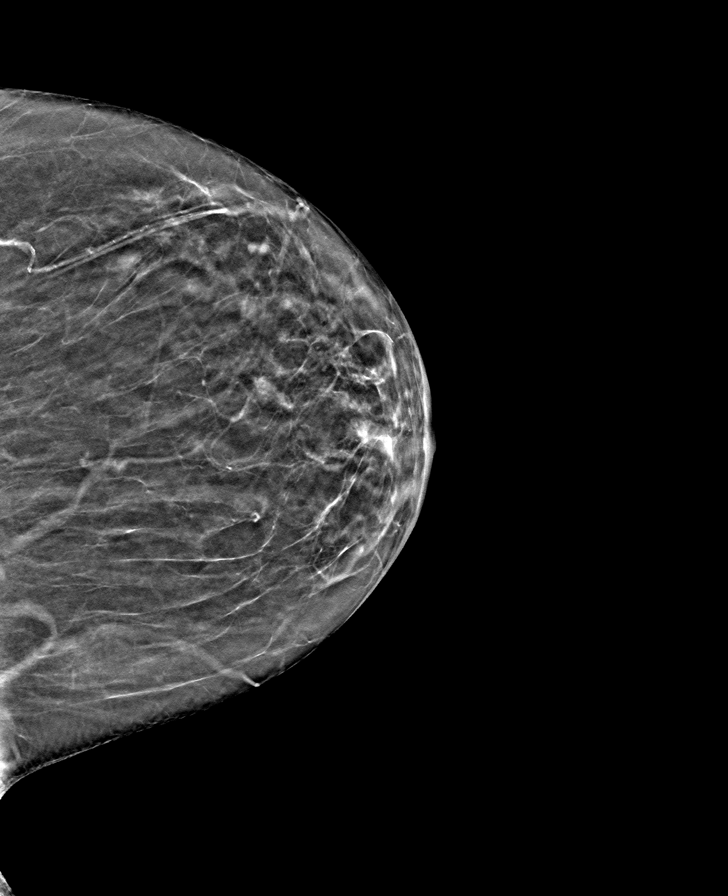

[R MLO tomo · tomo slice 37/72.0]
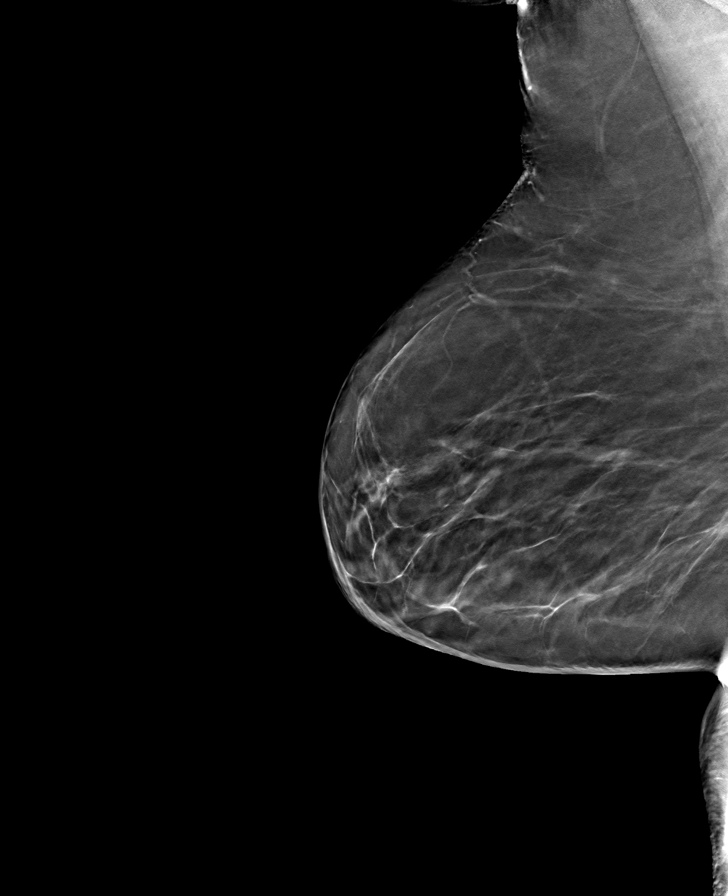

[R CC tomo · tomo slice 29/56.0]
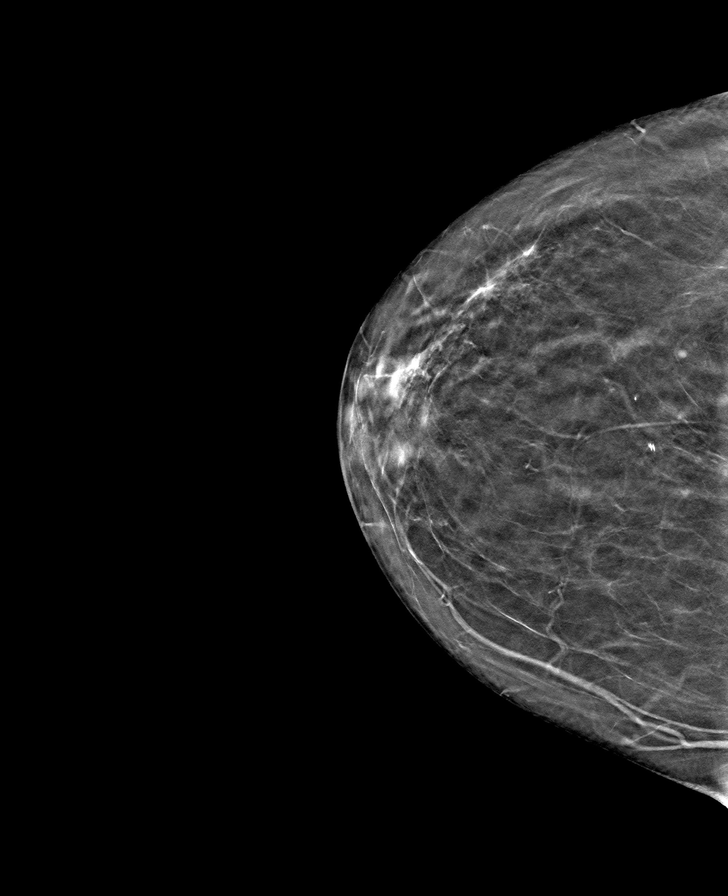

[8 of 24 positions shown; findings below may reference images not displayed]

ACR Breast Density Category b: There are scattered areas of
fibroglandular density.
FINDINGS: There are no findings suspicious for malignancy.
IMPRESSION: No mammographic evidence of malignancy. A result letter of this
screening mammogram will be mailed directly to the patient.

RECOMMENDATION:
Screening mammogram in one year. (Code:51-O-LD2)

BI-RADS CATEGORY  1: Negative.

## 2024-02-10 ENCOUNTER — Other Ambulatory Visit: Payer: Self-pay | Admitting: Medical Genetics

## 2024-03-07 ENCOUNTER — Other Ambulatory Visit

## 2024-03-14 ENCOUNTER — Other Ambulatory Visit
